# Patient Record
Sex: Female | Born: 1970
Health system: Southern US, Community
[De-identification: ages and names within clinical notes are randomized; demographics above are authoritative.]

## PROBLEM LIST (undated history)

## (undated) DIAGNOSIS — K769 Liver disease, unspecified: Secondary | ICD-10-CM

## (undated) DIAGNOSIS — Z950 Presence of cardiac pacemaker: Secondary | ICD-10-CM

## (undated) DIAGNOSIS — E079 Disorder of thyroid, unspecified: Secondary | ICD-10-CM

## (undated) DIAGNOSIS — E8881 Metabolic syndrome: Secondary | ICD-10-CM

## (undated) DIAGNOSIS — R7303 Prediabetes: Secondary | ICD-10-CM

## (undated) DIAGNOSIS — B009 Herpesviral infection, unspecified: Secondary | ICD-10-CM

## (undated) DIAGNOSIS — I341 Nonrheumatic mitral (valve) prolapse: Secondary | ICD-10-CM

## (undated) DIAGNOSIS — F419 Anxiety disorder, unspecified: Secondary | ICD-10-CM

## (undated) DIAGNOSIS — Z9889 Other specified postprocedural states: Secondary | ICD-10-CM

## (undated) DIAGNOSIS — I471 Supraventricular tachycardia, unspecified: Secondary | ICD-10-CM

## (undated) DIAGNOSIS — E7220 Disorder of urea cycle metabolism, unspecified: Secondary | ICD-10-CM

## (undated) DIAGNOSIS — E049 Nontoxic goiter, unspecified: Secondary | ICD-10-CM

## (undated) DIAGNOSIS — E559 Vitamin D deficiency, unspecified: Secondary | ICD-10-CM

## (undated) DIAGNOSIS — E785 Hyperlipidemia, unspecified: Secondary | ICD-10-CM

## (undated) DIAGNOSIS — N200 Calculus of kidney: Secondary | ICD-10-CM

## (undated) DIAGNOSIS — J189 Pneumonia, unspecified organism: Secondary | ICD-10-CM

## (undated) DIAGNOSIS — R112 Nausea with vomiting, unspecified: Secondary | ICD-10-CM

## (undated) DIAGNOSIS — K7460 Unspecified cirrhosis of liver: Secondary | ICD-10-CM

## (undated) DIAGNOSIS — E042 Nontoxic multinodular goiter: Secondary | ICD-10-CM

## (undated) DIAGNOSIS — N83209 Unspecified ovarian cyst, unspecified side: Secondary | ICD-10-CM

## (undated) DIAGNOSIS — I491 Atrial premature depolarization: Secondary | ICD-10-CM

## (undated) DIAGNOSIS — S83209A Unspecified tear of unspecified meniscus, current injury, unspecified knee, initial encounter: Secondary | ICD-10-CM

## (undated) DIAGNOSIS — K7581 Nonalcoholic steatohepatitis (NASH): Secondary | ICD-10-CM

## (undated) DIAGNOSIS — G43909 Migraine, unspecified, not intractable, without status migrainosus: Secondary | ICD-10-CM

## (undated) DIAGNOSIS — I10 Essential (primary) hypertension: Secondary | ICD-10-CM

## (undated) HISTORY — DX: Supraventricular tachycardia, unspecified: I47.10

## (undated) HISTORY — DX: Unspecified tear of unspecified meniscus, current injury, unspecified knee, initial encounter: S83.209A

## (undated) HISTORY — DX: Nonrheumatic mitral (valve) prolapse: I34.1

## (undated) HISTORY — PX: CARDIAC VALVE SURGERY: SHX40

## (undated) HISTORY — PX: GALLBLADDER SURGERY: SHX652

## (undated) HISTORY — PX: PACEMAKER INSERTION: SHX728

## (undated) HISTORY — DX: Nonalcoholic steatohepatitis (NASH): K75.81

## (undated) HISTORY — PX: TONSILLECTOMY: SUR1361

## (undated) HISTORY — PX: OTHER SURGICAL HISTORY: SHX169

## (undated) HISTORY — PX: ABLATION: SHX5711

## (undated) HISTORY — DX: Nontoxic multinodular goiter: E04.2

## (undated) HISTORY — DX: Herpesviral infection, unspecified: B00.9

## (undated) HISTORY — PX: KNEE CARTILAGE SURGERY: SHX688

## (undated) HISTORY — DX: Vitamin D deficiency, unspecified: E55.9

## (undated) HISTORY — DX: Prediabetes: R73.03

## (undated) HISTORY — DX: Unspecified cirrhosis of liver: K74.60

## (undated) HISTORY — DX: Migraine, unspecified, not intractable, without status migrainosus: G43.909

## (undated) HISTORY — DX: Atrial premature depolarization: I49.1

## (undated) HISTORY — DX: Disorder of urea cycle metabolism, unspecified: E72.20

## (undated) HISTORY — DX: Supraventricular tachycardia: I47.1

## (undated) HISTORY — PX: POLYPECTOMY: SHX149

## (undated) HISTORY — PX: ABDOMINAL HYSTERECTOMY: SHX81

## (undated) HISTORY — DX: Pneumonia, unspecified organism: J18.9

## (undated) HISTORY — DX: Unspecified ovarian cyst, unspecified side: N83.209

## (undated) HISTORY — PX: CARDIAC CATHETERIZATION: SHX172

---

## 2002-12-19 ENCOUNTER — Encounter: Payer: Self-pay | Admitting: Surgery

## 2002-12-19 ENCOUNTER — Emergency Department (HOSPITAL_COMMUNITY): Admission: EM | Admit: 2002-12-19 | Discharge: 2002-12-19 | Payer: Self-pay | Admitting: Emergency Medicine

## 2006-11-25 ENCOUNTER — Ambulatory Visit: Payer: Self-pay

## 2006-11-25 ENCOUNTER — Ambulatory Visit: Payer: Self-pay | Admitting: Internal Medicine

## 2006-11-25 ENCOUNTER — Encounter: Payer: Self-pay | Admitting: Internal Medicine

## 2010-10-14 NOTE — Assessment & Plan Note (Signed)
[facility]                            CARDIOLOGY OFFICE NOTE   [name], [name]                         MRN:          [id number]  DATE:11/25/2006                            DOB:          [birth year]    Tricia Ford CARDIOLOGY CLINIC NOTE   REASON FOR VISIT:  Chest pain.   HISTORY OF PRESENT ILLNESS:  Ms. Tricia Ford is a very pleasant 40 year old  woman with multiple medical problems including hypertension,  hyperlipidemia, history of gall stones status post cholecystectomy, and  supraventricular tachycardia status post multiple ablations.  She is  referred through the courtesy of Dr. Loyola Mast.   Ms. Tricia Ford denies any history of known coronary artery disease.  She has  had problems with SVT and has undergone 3 catheter ablations, 2 were  done in [facility] by Dr. Clovis Riley, and 1 was done at [facility] by Dr.  Dario Ave.  The last ablation was done in 2001.   She tells me that, over the past 3 weeks, she has developed central  chest pain.  She says the pain can come and go.  It feels like a crampy  sensation in her sternum.  It usually lasts seconds, but can sometimes  last longer.  She has not had any diaphoresis, but she did have an  episode where she felt hot and clammy.  On Tuesday, she had a severe  episode where it lasted all day and is a very intense pain.  She went to  [facility] and apparently had an EKG and some cardiac markers,  which were negative.  Then they scheduled her for a stress  echocardiogram, but due to cost reasons, she did not go.  Over the past  few days the pain has been somewhat better.  She has been taking some  Prevacid as well as muscle relaxants, but she has not noticed any  significant relief from that in particular.   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  During the day, she does not notice any palpitations,  but if she wakes up for another reason at night she does  note 30 seconds  of palpitations.  Then she is able to go back to bed.   REVIEW OF SYSTEMS:  She denies any bright red blood per rectum.  No  melena.  No fevers or chills.  No nausea or vomiting.  She did have an  ultrasound of her lower extremities due to varicose veins recently,  which did not show any evidence of clot in her left lower extremity.  She has not had any long car trips or plane rides recently.  The  remainder of the review of systems is negative, except for HPI and  problem list.   PROBLEM LIST:  1. SVT status post 3 ablations as described in the HPI.  2. Hypertension.  3. History of preeclampsia.  4. Hyperlipidemia with total cholesterol over 300 in the past.      Previously on Advicor.  5. Gall stones status post cholecystectomy.  6. Nephrolithiasis.   CURRENT MEDICATIONS:  Birth control pills.  Valtrex p.r.n.  Aspirin  p.r.n.   ALLERGIES:  No reported drug allergies.   SOCIAL HISTORY:  She is married.  She has 2 children, a 56 year old and  a 45-year-old.  She is a Theatre stage manager.  She denies any tobacco or  significant alcohol use.   FAMILY HISTORY:  Mother is 73 years old, alive.  She has diabetes,  hypertension, and tobacco use.  She does not know her dad.  She has 2  brothers, 1 is 78 and 1 is 28.  The 26 year old reportedly underwent  cardiac catheterization.  There is some question of a myocardial  infarction and angioplasty, but he has not had a stent.  Kateri Mc is 38  years old and died from a myocardial infarction.  He has a history of  multiple risk factors, including tobacco use.   PHYSICAL EXAM:  She is in no acute distress.  She ambulates around the  clinic without any respiratory difficulty.  Blood pressure is 126/78, heart rate 70, weight 185.  HEENT:  Normal.  NECK:  Supple.  There is no JVD.  Carotids are 2+ bilaterally without  bruits.  There is no lymphadenopathy.  Her thyroid area looks a bit  full, but I am unable to palpate any  significant thyromegaly or nodules.  CARDIAC:  She is a regular rate and rhythm.  No murmurs, rubs, or  gallops.  PMI is not displaced.  LUNGS:  Clear.  ABDOMEN:  Soft, nontender, nondistended.  No hepatosplenomegaly.  No  bruits.  No masses.  Good bowel sounds.  EXTREMITIES:  Warm with no cyanosis, clubbing, or edema.  She does have  mild varicosities.  No rash.  NEUROLOGIC:  Alert and oriented x3.  Cranial nerves 2-12 are intact.  Moves all 4 extremities without difficulty.  Affect is normal.  Pulses  normal throughout.   EKG shows normal sinus rhythm at a rate of 70 with no ST-T wave  abnormalities.   ASSESSMENT AND PLAN:  1. Atypical chest pain.  Given the quality of her symptoms, as well as      the fact that she had a prolonged episode of pain in the setting of      a normal EKG and normal cardiac markers at [facility], I      think the risk for significant underlying coronary artery disease      is quite low, especially given her age.  However, she does have      somewhat of a family history.  I suggested that it might be      reasonable to wait another week and to see if this gets better.      However, she is very interested in making sure that there is      nothing wrong as she has had a significant amount of problems in      the past.  We have thus gone ahead and scheduled her for a stress      echocardiogram, which will hopefully be able to be done today.  2. General medical issues.  By history, she does have multiple cardiac      risk factors, and we      discussed risk factor modification.  She will need to be followed      up, particularly for her cholesterol.     Bevelyn Buckles. Bensimhon, MD  Electronically Signed    DRB/MedQ  DD: 11/25/2006  DT: 11/25/2006  Job #: [id number]

## 2010-10-17 NOTE — Consult Note (Signed)
[name], Tricia Ford NO.:  [id number]   MEDICAL RECORD NO.:  [id number]                   PATIENT TYPE:  EMS   LOCATION:  ED                                   FACILITY:  [facility]   PHYSICIAN:  Velora Heckler, M.D.                DATE OF BIRTH:  04/15/1971   DATE OF CONSULTATION:  12/19/2002  DATE OF DISCHARGE:                                   CONSULTATION   REFERRING PHYSICIAN:  Melissa V. Rana Snare, M.D.   REASON FOR CONSULTATION:  Abdominal pain.   BRIEF HISTORY:  The patient is a 40 year old white female works at [employer], presents for evaluation with less then 24 hour history of lower  abdominal pain.  The patient notes sudden onset of lower abdominal pain  approximately 1 a.m. on July 20th.  This was associated with nausea, but no  emesis.  The patient denies fevers or chills.  She denies diarrhea or  constipation.  The pain was persistent in the suprapubic region.  The  patient did go to work, however, continued to have discomfort.  She does  note that the pain has improved over the course of the day.  The patient was  seen by Dr. Rana Snare.  Laboratory studies in the office showed a normal white  count and a normal urinalysis.  The patient is referred at this time to rule  out acute appendicitis.   PAST MEDICAL HISTORY:  1. History of Wolff-Parkinson-White syndrome evaluated at [facility] and [facility], status post     ablation of WPW x3.  2. History of irritable bowel syndrome.  3. History of hiatal hernia.  4. History of nephrolithiasis.  5. History of left knee arthroscopy.   MEDICATIONS:  Birth control pills, recently discontinued Cardizem.   ALLERGIES:  None known.   SOCIAL HISTORY:  The patient is married.  She has one child.  She works at  [employer] in [place].  She does not smoke.  She does not drink alcohol   FAMILY HISTORY:  Notable for cardiac  disease in a grandmother and a cancer  of unknown type in both grandfathers.   REVIEW OF SYSTEMS:  A 15-system review without significant other positives  except as noted above.   EXAM:  GENERAL:  A 40 year old, well-developed, well-nourished, white female  on a stretcher in the emergency department.  VITAL SIGNS:  Temp 97.1, pulse 75, respirations 18, blood pressure 139/90.  HEENT:  Normocephalic.  Sclerae are clear.  Conjunctivae are clear.  Dentition is good.  Mucous membranes are moist.  Voice is normal.  NECK:  Anterior examination of the neck shows it to be symmetric.  Thyroid  is normal without nodularity.  There is no anterior-posterior  lymphadenopathy.  There are no supraclavicular masses.  CHEST:  Clear to auscultation bilaterally.  There are no rhonchi or rales.  CARDIAC:  Regular rate and rhythm without murmur.  Peripheral pulses are  full.  ABDOMEN:  Soft.  There are bowel sounds present.  There is tenderness to  palpation in the bilateral lower quadrants of the suprapubic region.  There  is no palpable mass.  There is no guarding.  There is no rebound tenderness.  EXTREMITIES:  Nontender without edema.  NEUROLOGIC:  The patient is alert and oriented without focal deficit.   LABORATORY STUDIES:  White blood count 8.7, hemoglobin 12.9, platelet count  331,000,  segmented neutrophils 67%, lymphocytes 26%.   RADIOGRAPHIC STUDIES:  CT scan abdomen and pelvis reviewed with Dr. Davonna Belling shows a normal appendix.  There is a moderate amount of fluid in the  cul-de-sac.  There is a cystic structure visible in the right adnexa  consistent with ruptured ovarian cyst.   IMPRESSION:  No evidence of acute appendicitis, likely ruptured right  ovarian cyst.   PLAN:  1. Discharge with oral narcotics to take as needed for pain.  2. Advised to take nonsteroidal anti-inflammatories such as Advil or Aleve     as needed for pain.  3. Advise to follow up with a gynecologist for a  pelvic exam and pelvic     ultrasound within the next one to two weeks.                                               Velora Heckler, M.D.    TMG/MEDQ  D:  12/19/2002  T:  12/19/2002  Job:  [id number]   cc:   Angus Seller. Rana Snare, M.D.  [address] [place]  [place] [address]  Fax: [phone]   Cristino Martes, M.D.  [place]

## 2012-12-22 ENCOUNTER — Other Ambulatory Visit: Payer: Self-pay | Admitting: Endocrinology

## 2012-12-22 DIAGNOSIS — E042 Nontoxic multinodular goiter: Secondary | ICD-10-CM

## 2012-12-28 ENCOUNTER — Ambulatory Visit
Admission: RE | Admit: 2012-12-28 | Discharge: 2012-12-28 | Disposition: A | Discharge status: Home / Self Care | Payer: Commercial Managed Care - PPO | Source: Ambulatory Visit | Attending: Endocrinology | Admitting: Endocrinology

## 2012-12-28 ENCOUNTER — Other Ambulatory Visit (HOSPITAL_COMMUNITY)
Admission: RE | Admit: 2012-12-28 | Discharge: 2012-12-28 | Disposition: A | Discharge status: Home / Self Care | Payer: Commercial Managed Care - PPO | Source: Ambulatory Visit | Attending: Interventional Radiology | Admitting: Interventional Radiology

## 2012-12-28 DIAGNOSIS — E042 Nontoxic multinodular goiter: Secondary | ICD-10-CM

## 2012-12-28 DIAGNOSIS — E041 Nontoxic single thyroid nodule: Secondary | ICD-10-CM | POA: Insufficient documentation

## 2013-05-31 ENCOUNTER — Encounter (HOSPITAL_COMMUNITY): Payer: Self-pay | Admitting: Emergency Medicine

## 2013-05-31 ENCOUNTER — Emergency Department (HOSPITAL_COMMUNITY)
Admission: EM | Admit: 2013-05-31 | Discharge: 2013-05-31 | Disposition: A | Discharge status: Home / Self Care | Payer: Self-pay | Attending: Emergency Medicine | Admitting: Emergency Medicine

## 2013-05-31 ENCOUNTER — Emergency Department (HOSPITAL_COMMUNITY): Payer: Self-pay

## 2013-05-31 DIAGNOSIS — E119 Type 2 diabetes mellitus without complications: Secondary | ICD-10-CM | POA: Insufficient documentation

## 2013-05-31 DIAGNOSIS — J189 Pneumonia, unspecified organism: Secondary | ICD-10-CM | POA: Insufficient documentation

## 2013-05-31 DIAGNOSIS — R5381 Other malaise: Secondary | ICD-10-CM | POA: Insufficient documentation

## 2013-05-31 DIAGNOSIS — J029 Acute pharyngitis, unspecified: Secondary | ICD-10-CM | POA: Insufficient documentation

## 2013-05-31 DIAGNOSIS — I10 Essential (primary) hypertension: Secondary | ICD-10-CM | POA: Insufficient documentation

## 2013-05-31 DIAGNOSIS — R42 Dizziness and giddiness: Secondary | ICD-10-CM | POA: Insufficient documentation

## 2013-05-31 DIAGNOSIS — Z8719 Personal history of other diseases of the digestive system: Secondary | ICD-10-CM | POA: Insufficient documentation

## 2013-05-31 DIAGNOSIS — R209 Unspecified disturbances of skin sensation: Secondary | ICD-10-CM | POA: Insufficient documentation

## 2013-05-31 DIAGNOSIS — Z9089 Acquired absence of other organs: Secondary | ICD-10-CM | POA: Insufficient documentation

## 2013-05-31 DIAGNOSIS — R6889 Other general symptoms and signs: Secondary | ICD-10-CM | POA: Insufficient documentation

## 2013-05-31 DIAGNOSIS — Z95818 Presence of other cardiac implants and grafts: Secondary | ICD-10-CM | POA: Insufficient documentation

## 2013-05-31 DIAGNOSIS — Z87442 Personal history of urinary calculi: Secondary | ICD-10-CM | POA: Insufficient documentation

## 2013-05-31 DIAGNOSIS — R109 Unspecified abdominal pain: Secondary | ICD-10-CM | POA: Insufficient documentation

## 2013-05-31 DIAGNOSIS — F411 Generalized anxiety disorder: Secondary | ICD-10-CM | POA: Insufficient documentation

## 2013-05-31 HISTORY — DX: Metabolic syndrome: E88.81

## 2013-05-31 HISTORY — DX: Essential (primary) hypertension: I10

## 2013-05-31 HISTORY — DX: Liver disease, unspecified: K76.9

## 2013-05-31 HISTORY — DX: Hyperlipidemia, unspecified: E78.5

## 2013-05-31 HISTORY — DX: Nontoxic goiter, unspecified: E04.9

## 2013-05-31 HISTORY — DX: Disorder of thyroid, unspecified: E07.9

## 2013-05-31 HISTORY — DX: Calculus of kidney: N20.0

## 2013-05-31 HISTORY — DX: Gilbert syndrome: E80.4

## 2013-05-31 HISTORY — DX: Metabolic syndrome: E88.810

## 2013-05-31 HISTORY — DX: Anxiety disorder, unspecified: F41.9

## 2013-05-31 LAB — URINALYSIS, ROUTINE W REFLEX MICROSCOPIC
Bilirubin Urine: NEGATIVE
Glucose, UA: NEGATIVE mg/dL
Hgb urine dipstick: NEGATIVE
Protein, ur: NEGATIVE mg/dL
Urobilinogen, UA: 0.2 mg/dL (ref 0.0–1.0)

## 2013-05-31 LAB — COMPREHENSIVE METABOLIC PANEL
ALT: 94 U/L — ABNORMAL HIGH (ref 0–35)
AST: 50 U/L — ABNORMAL HIGH (ref 0–37)
Alkaline Phosphatase: 130 U/L — ABNORMAL HIGH (ref 39–117)
CO2: 21 mEq/L (ref 19–32)
Chloride: 102 mEq/L (ref 96–112)
GFR calc Af Amer: >90 mL/min (ref >90)
GFR calc non Af Amer: >90 mL/min (ref >90)
Glucose, Bld: 127 mg/dL — ABNORMAL HIGH (ref 70–99)
Potassium: 3.8 mEq/L (ref 3.7–5.3)
Sodium: 138 mEq/L (ref 137–147)
Total Bilirubin: 1 mg/dL (ref 0.3–1.2)

## 2013-05-31 LAB — POCT I-STAT TROPONIN I: Troponin i, poc: 0 ng/mL (ref 0.00–0.08)

## 2013-05-31 LAB — TSH: TSH: 0.657 u[IU]/mL (ref 0.350–4.500)

## 2013-05-31 LAB — CBC WITH DIFFERENTIAL/PLATELET
Basophils Absolute: 0 10*3/uL (ref 0.0–0.1)
Eosinophils Relative: 0 % (ref 0–5)
Lymphocytes Relative: 10 % — ABNORMAL LOW (ref 12–46)
Lymphs Abs: 1.1 10*3/uL (ref 0.7–4.0)
MCV: 85.7 fL (ref 78.0–100.0)
Neutro Abs: 9.5 10*3/uL — ABNORMAL HIGH (ref 1.7–7.7)
Platelets: 330 10*3/uL (ref 150–400)
RBC: 4.6 MIL/uL (ref 3.87–5.11)
WBC: 11 10*3/uL — ABNORMAL HIGH (ref 4.0–10.5)

## 2013-05-31 LAB — URINE MICROSCOPIC-ADD ON

## 2013-05-31 LAB — GLUCOSE, CAPILLARY: Glucose-Capillary: 101 mg/dL — ABNORMAL HIGH (ref 70–99)

## 2013-05-31 LAB — HCG, SERUM, QUALITATIVE: Preg, Serum: NEGATIVE

## 2013-05-31 MED ORDER — AZITHROMYCIN 250 MG PO TABS
500.0000 mg | ORAL_TABLET | Freq: Once | ORAL | Status: AC
  Administered 2013-05-31: 500 mg via ORAL
  Filled 2013-05-31: qty 2

## 2013-05-31 MED ORDER — CEFPROZIL 500 MG PO TABS: 500.0000 mg | ORAL_TABLET | Freq: Two times a day (BID) | ORAL | Status: DC

## 2013-05-31 MED ORDER — SODIUM CHLORIDE 0.9 % IV BOLUS (SEPSIS)
1000.0000 mL | Freq: Once | INTRAVENOUS | Status: AC
  Administered 2013-05-31: 1000 mL via INTRAVENOUS

## 2013-05-31 MED ORDER — AZITHROMYCIN 250 MG PO TABS: 250.0000 mg | ORAL_TABLET | Freq: Every day | ORAL | Status: DC

## 2013-05-31 MED ORDER — DEXTROSE 5 % IV SOLN
1.0000 g | Freq: Once | INTRAVENOUS | Status: AC
  Administered 2013-05-31: 1 g via INTRAVENOUS
  Filled 2013-05-31: qty 10

## 2013-05-31 NOTE — ED Notes (Addendum)
Per EMS, Pt, c/o flu like symptoms, cough, cold, and congestion x 5 days and generalized abdominal cramping started en route.  Pt sts "I had a rattle in my chest this morning and then my upper extremities went cold and numb.  This has happened 4 times and has currently resolved.  I feel like everything loosened and I had no control.  I just don't feel right."  Pt did not take her xanax last night.  Pt was seen by PCP x 4 days and tested for flu, which resulted negative.  Pt reports that she has "tested negative in the past, when she actually had the flu."  NAD noted.  BP is elevated and Pt is non-compliant.

## 2013-05-31 NOTE — ED Provider Notes (Addendum)
CSN: [id number]     Arrival date & time 05/31/13  8657 History   First MD Initiated Contact with Patient 05/31/13 1009     Chief Complaint  Patient presents with  . Flu like Symptoms   . Numbness  . Abdominal Pain   HPI  Patient presents here with multiple complaints. Her main reason for presentation this morning as she was sitting at her desk and she hurt her chest which she describes as "death rattle". She has a cough congestion is identified illness since Friday with cough congestion. Has some nausea some abdominal cramping today as well but had some diarrhea after arriving here. En route his flexion is quite anxious. She felt some numbness and heaviness in her bilateral arms. This improved her some reassurance per EMS. This recurred several times en route. She arrives here without the feeling of numbness or extremity symptoms. She does have chest pain. She is also short of breath currently. She has had shortness of breath and had a negative flu swab on Friday. She is rather insistent that she has the flu and states that last year she had a negative flu swab on one day, and the next day was positive. She has not been febrile. Her eyes have been bloodshot.  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 She is concerned that she is going to die because she felt the death rattle. She thought she was going to pass out for a short time. She states that she has "stage I liver disease". And she states she's been told this is "disregard in my liver". She states "had to go to [facility] and he thought it was stage III but isn't". She occasionally gets a heavy time difficulty with concentration. She states usually this her blood pressure is high. She takes blood pressure medicine although when she finds her blood pressure high. She states "I'm noncompliant with that I went away it makes me feel". I've advised her  against using TIA-like symptoms to be diaper when she will take her blood pressure medications.   She also states "sometimes when my liver acts up,and  ammonia gets high". She does also state that they researched thyroid disease on the Internet and they're fairly convinced her thyroid is abnormal. She states she's had multiple evaluations and has 2 thyroid nodules. She's had 2 negative thyroid biopsies for malignancy. She cannot tell what her thyroid function has been in the past. She's never been on treatment for hyper, or hypothyroidism. She normally does take xanax at  night. She did not take last night because she taking NyQuil.  Past Medical History  Diagnosis Date  . Liver disease   . Hypertension   . Gilbert syndrome   . Diabetes mellitus without complication   . Metabolic syndrome   . Anxiety   . Hyperlipidemia   . Kidney stone   . Thyroid disease   . Goiter    Past Surgical History  Procedure Laterality Date  . Ablation    . Cardiac catheterization    . Abdominal hysterectomy    . Cesarean section    . Tonsillectomy    . Knee cartilage surgery    . Liver biopsy     History reviewed. No pertinent family history. History  Substance Use Topics  . Smoking status: Never Smoker   . Smokeless tobacco: Never Used  .  Alcohol Use: Yes     Comment: rarely   OB History   Grav Para Term Preterm Abortions TAB SAB Ect Mult Living                 Review of Systems  Constitutional: Positive for fatigue. Negative for fever, chills, diaphoresis and appetite change.  HENT: Positive for congestion and sore throat. Negative for mouth sores and trouble swallowing.   Eyes: Negative for visual disturbance.  Respiratory: Negative for cough, chest tightness, shortness of breath and wheezing.        A feeling of "congestion" and the description of a "death rattle" in her chest with breathing at the office  Cardiovascular: Negative for chest pain.  Gastrointestinal: Negative for nausea,  vomiting, abdominal pain, diarrhea and abdominal distention.  Endocrine: Positive for cold intolerance and heat intolerance. Negative for polydipsia, polyphagia and polyuria.  Genitourinary: Negative for dysuria, frequency and hematuria.  Musculoskeletal: Negative for gait problem.  Skin: Negative for color change, pallor and rash.  Neurological: Positive for dizziness. Negative for syncope, light-headedness and headaches.  Hematological: Does not bruise/bleed easily.  Psychiatric/Behavioral: Negative for behavioral problems and confusion.    Allergies  Zomig  Home Medications   Current Outpatient Rx  Name  Route  Sig  Dispense  Refill  . ALPRAZolam (XANAX) 0.5 MG tablet   Oral   Take 0.5 mg by mouth 3 (three) times daily as needed for anxiety.         Marland Kitchen DM-Doxylamine-Acetaminophen (NYQUIL COLD & FLU PO)   Oral   Take 30 mLs by mouth at bedtime as needed (cold/flu).         Marland Kitchen ibuprofen (ADVIL,MOTRIN) 200 MG tablet   Oral   Take 800 mg by mouth every 6 (six) hours as needed for mild pain or moderate pain.         Marland Kitchen lisinopril (PRINIVIL,ZESTRIL) 10 MG tablet   Oral   Take 10 mg by mouth daily as needed (when BP is high).         . valACYclovir (VALTREX) 500 MG tablet   Oral   Take 500 mg by mouth 2 (two) times daily as needed (fever blisters).         Marland Kitchen azithromycin (ZITHROMAX Z-PAK) 250 MG tablet   Oral   Take 1 tablet (250 mg total) by mouth daily.   20 tablet   0   . cefPROZIL (CEFZIL) 500 MG tablet   Oral   Take 1 tablet (500 mg total) by mouth 2 (two) times daily.   20 tablet   0    BP 151/100  Pulse 84  Temp(Src) 98 F (36.7 C) (Oral)  Resp 20  SpO2 100% Physical Exam  Constitutional: She is oriented to person, place, and time. She appears well-developed and well-nourished. No distress.  Awake alert. Rapid speech. Seems somewhat anxious.  HENT:  Head: Normocephalic.  Conjunctiva are not injected. Sclerae are anicteric. Conjunctivae are not  pale  Eyes: Conjunctivae are normal. Pupils are equal, round, and reactive to light. No scleral icterus.  Neck: Normal range of motion. Neck supple. No thyromegaly present.  No bruits in her neck.  No thyromegaly  Cardiovascular: Normal rate and regular rhythm.  Exam reveals no gallop and no friction rub.   No murmur heard. Heart tones. Not tachycardic. No arrhythmias or ectopy.  Pulmonary/Chest: Effort normal and breath sounds normal. No respiratory distress. She has no wheezes. She has no rales.  Clear breath sounds. No  prolongation.  Abdominal: Soft. Bowel sounds are normal. She exhibits no distension. There is no tenderness. There is no rebound.  No abdominal pain exam and. Benign abdomen is normal active bowel sounds  Musculoskeletal: Normal range of motion.  Neurological: She is alert and oriented to person, place, and time.  Normal use of the upper extremities without pronator drift or weakness. Normal sensation. Normal finger to nose. Normal strength in lower extremities.  Skin: Skin is warm and dry. No rash noted.  Psychiatric: She has a normal mood and affect. Her behavior is normal.    ED Course  Procedures (including critical care time) Labs Review Labs Reviewed  GLUCOSE, CAPILLARY - Abnormal; Notable for the following:    Glucose-Capillary 101 (*)    All other components within normal limits  CBC WITH DIFFERENTIAL - Abnormal; Notable for the following:    WBC 11.0 (*)    Neutrophils Relative % 86 (*)    Neutro Abs 9.5 (*)    Lymphocytes Relative 10 (*)    All other components within normal limits  COMPREHENSIVE METABOLIC PANEL - Abnormal; Notable for the following:    Glucose, Bld 127 (*)    AST 50 (*)    ALT 94 (*)    Alkaline Phosphatase 130 (*)    All other components within normal limits  URINALYSIS, ROUTINE W REFLEX MICROSCOPIC - Abnormal; Notable for the following:    Specific Gravity, Urine 1.003 (*)    Leukocytes, UA SMALL (*)    All other components  within normal limits  URINE MICROSCOPIC-ADD ON - Abnormal; Notable for the following:    Squamous Epithelial / LPF FEW (*)    All other components within normal limits  LIPASE, BLOOD  HCG, SERUM, QUALITATIVE  AMMONIA  TSH  CG4 I-STAT (LACTIC ACID)  POCT I-STAT TROPONIN I   Imaging Review No results found.  EKG Interpretation    Date/Time:  Wednesday May 31 2013 11:20:41 EST Ventricular Rate:  84 PR Interval:  173 QRS Duration: 79 QT Interval:  386 QTC Calculation: 456 R Axis:   61 Text Interpretation:  Sinus rhythm Consider left ventricular hypertrophy ED PHYSICIAN INTERPRETATION AVAILABLE IN [facility] Confirmed by TEST, RECORD ([id number]) on 06/02/2013 9:07:54 AM            MDM   1. Lingular pneumonia     Patient has lung complaints upon arrival. I think her intermittent feeling of paresthesias and temperature was approximately to anxiety. She denies that she was hyperventilating. However on arrival here she is still anxious. She is in a sinus rhythm essentially asymptomatic. Suddenly she felt better after a bowel movement with some diarrhea. Her cramping is resolved. She feels like his normal control rub extremities without symptoms. She is neurologically intact on exam.  11:41: I was called back to  the room by our staff as the patient had alerted that she was "feeling it again ". Her husband (who is quite anxious and somewhat overbearing here) stated that her "number on the top skyrocketed". He indicates the numbers on the monitor. He cannot tell me what it showed her rate to be. She stated, " I don't know a lot about EKGs but when I  looked at that monitor my Q waves had gotten really wide". When the patient called the unit clerk about her recurrance of symptoms, the clerk very astutely and quickly printed rhythm strips which showed sinus rhythm with a normal-appearing morphology and intervals.  She has not had episodes of  tachycardia. I've examined the patient  again,  she is very anxious. Husband is very upset that "you have any been treated her blood pressure". Recheck blood pressure is 149/89 she is in sinus rhythm she is alert, and very anxious as is her husband.  Is able to at least for the time being reassure her that needed additional studies including x-ray labs and an order to counsel her regarding her symptoms.  13:47:  Discuss all of her laboratory results and x-rays with her. Explained lingular pneumonia to her. She been treated with IV Rocephin and by mouth Zithromax. Per Dr. Ramiro Harvest her physician for follow up on her TSH.  Appropriate for outpatient treatment. This may represent a post influenza pneumonia. If not improving over the next several days immediate recheck with primary care. ER with acute worsening.    Rolland Porter, MD 05/31/13 1152  Rolland Porter, MD 06/04/13 [phone]

## 2013-05-31 NOTE — ED Notes (Signed)
Made aware of symptoms return please return

## 2013-05-31 NOTE — Progress Notes (Signed)
   CARE MANAGEMENT ED NOTE 05/31/2013  Patient:  Tricia Ford, Tricia Ford   Account Number:  [id number]  Date Initiated:  05/31/2013  Documentation initiated by:  Edd Arbour  Subjective/Objective Assessment:   42 yr old female umr/uhc ppo pt c/o flu like symptoms, cough, cold, and congestion x 5 days and generalized abdominal cramping     Subjective/Objective Assessment Detail:   no pcp listed in EPIC Pt states pcp is Dr Fredia Beets in [facility] family [facility]     Action/Plan:   EPIC updated   Action/Plan Detail:   Anticipated DC Date:  05/31/2013     Status Recommendation to Physician:   Result of Recommendation:    Other ED Services  Consult Working Plan    DC Planning Services  Other  PCP issues  Outpatient Services - Pt will follow up    Choice offered to / List presented to:            Status of service:  Completed, signed off  ED Comments:   ED Comments Detail:

## 2013-08-31 ENCOUNTER — Other Ambulatory Visit: Payer: Self-pay | Admitting: Endocrinology

## 2013-08-31 DIAGNOSIS — E049 Nontoxic goiter, unspecified: Secondary | ICD-10-CM

## 2013-09-05 ENCOUNTER — Ambulatory Visit
Admission: RE | Admit: 2013-09-05 | Discharge: 2013-09-05 | Disposition: A | Discharge status: Home / Self Care | Payer: 59 | Source: Ambulatory Visit | Attending: Endocrinology | Admitting: Endocrinology

## 2013-09-05 DIAGNOSIS — E049 Nontoxic goiter, unspecified: Secondary | ICD-10-CM

## 2013-11-17 ENCOUNTER — Ambulatory Visit (INDEPENDENT_AMBULATORY_CARE_PROVIDER_SITE_OTHER)
Admission: RE | Admit: 2013-11-17 | Discharge: 2013-11-17 | Disposition: A | Discharge status: Home / Self Care | Payer: 59 | Source: Ambulatory Visit | Attending: Internal Medicine | Admitting: Internal Medicine

## 2013-11-17 ENCOUNTER — Ambulatory Visit (INDEPENDENT_AMBULATORY_CARE_PROVIDER_SITE_OTHER): Payer: 59 | Admitting: Internal Medicine

## 2013-11-17 ENCOUNTER — Encounter (INDEPENDENT_AMBULATORY_CARE_PROVIDER_SITE_OTHER): Payer: Self-pay

## 2013-11-17 ENCOUNTER — Encounter: Payer: Self-pay | Admitting: Internal Medicine

## 2013-11-17 VITALS — BP 132/98 | HR 71 | Temp 97.9°F | Ht 67.0 in | Wt 214.8 lb

## 2013-11-17 DIAGNOSIS — Z23 Encounter for immunization: Secondary | ICD-10-CM

## 2013-11-17 DIAGNOSIS — R05 Cough: Secondary | ICD-10-CM

## 2013-11-17 DIAGNOSIS — R059 Cough, unspecified: Secondary | ICD-10-CM

## 2013-11-17 DIAGNOSIS — R058 Other specified cough: Secondary | ICD-10-CM

## 2013-11-17 DIAGNOSIS — I10 Essential (primary) hypertension: Secondary | ICD-10-CM

## 2013-11-17 NOTE — Progress Notes (Signed)
Subjective:    Patient ID: Tricia Ford, female    DOB: 05/29/1971  MRN: [id number]  HPI  [name]  42 yowf never smoker, exp to mother smoking, "always" sickly as child with pna age 89 hosp and missed a bunch of school, never able to keep up in sports then since around  2008 freq flares dx as bronchitis/ pna placed on acei around around 2013 and has not recovered from last episode from flu early Dec 2014 with lingular pna on cxr 05/31/13.   11/17/2013 1st [facility] office visit/ Tricia Ford / on ACEi Chief Complaint  Patient presents with  . PULMONARY CONSULT    Self referral- h/o PNA, wheezing, SOB. Cxr 05/31/14. Completed zpak x 5 days ago for URI.   baseline =  sob walking and talking but sometimes even at rest if talking  assoc with voice fatigue/ hoarseness Cough sporadic/ mostly daytimes, sometimes severe, always dry  with  assoc sore throat at times esp at end of day  Only thing that makes it better is narcotic cough med. Already on levaquin and no better   No obvious other patterns in day to day or daytime variabilty or assoc chronic cough or cp or chest tightness, subjective wheeze overt sinus or hb symptoms. No unusual exp hx or h/o childhood   asthma or knowledge of premature birth.  Sleeping ok without nocturnal  or early am exacerbation  of respiratory  c/o's or need for noct saba. Also denies any obvious fluctuation of symptoms with weather or environmental changes or other aggravating or alleviating factors except as outlined above   Current Medications, Allergies, Complete Past Medical History, Past Surgical History, Family History, and Social History were reviewed in [facility] electronic medical record.              Review of Systems  Constitutional: Positive for unexpected weight change. Negative for fever.  HENT: Positive for congestion, ear pain, sneezing and sore throat. Negative for dental problem, nosebleeds, postnasal drip, rhinorrhea, sinus  pressure and trouble swallowing.   Eyes: Negative for redness and itching.  Respiratory: Positive for choking and shortness of breath. Negative for cough, chest tightness and wheezing.   Cardiovascular: Positive for palpitations. Negative for leg swelling.  Gastrointestinal: Negative for nausea and vomiting.  Genitourinary: Negative for dysuria.  Musculoskeletal: Positive for arthralgias. Negative for joint swelling.  Skin: Negative for rash.  Neurological: Positive for headaches.  Hematological: Does not bruise/bleed easily.  Psychiatric/Behavioral: Positive for dysphoric mood. The patient is nervous/anxious.        Objective:   Physical Exam  Wt Readings from Last 3 Encounters:  11/17/13 214 lb 12.8 oz (97.433 kg)      Pleasant health appearing wf with classic voice fatigue and harsh barking cough  HEENT: nl dentition, turbinates, and orophanx. Nl external ear canals without cough reflex   NECK :  without JVD/Nodes/TM/ nl carotid upstrokes bilaterally   LUNGS: no acc muscle use, clear to A and P bilaterally without cough on insp or exp maneuvers   CV:  RRR  no s3 or murmur or increase in P2, no edema   ABD:  soft and nontender with nl excursion in the supine position. No bruits or organomegaly, bowel sounds nl  MS:  warm without deformities, calf tenderness, cyanosis or clubbing  SKIN: warm and dry without lesions    NEURO:  alert, approp, no deficits     cxr 05/31/14 Lingula versus anterior upper lobe pneumonia. Post  treatment  radiographs recommended to document resolution.  CXR  11/17/2013 :  No active cardiopulmonary disease.     Assessment & Plan:

## 2013-11-17 NOTE — Patient Instructions (Addendum)
Pantoprazole (protonix) 40 mg   Take 30-60 min before first meal of the day and Pepcid 20 mg one bedtime until return to office - this is the best way to tell whether stomach acid is contributing to your problem.    Stop lisinopril  GERD (REFLUX)  is an extremely common cause of respiratory symptoms, many times with no significant heartburn at all.    It can be treated with medication, but also with lifestyle changes including avoidance of late meals, excessive alcohol, smoking cessation, and avoid fatty foods, chocolate, peppermint, colas, red wine, and acidic juices such as orange juice.  NO MINT OR MENTHOL PRODUCTS SO NO COUGH DROPS  USE SUGARLESS CANDY INSTEAD (jolley ranchers or Quarry managertover's or lifesavers)  NO OIL BASED VITAMINS - use powdered substitutes.  Best cough med is delsym 2 tsp every 12 hours but if not effective and you can afford to be drowsy go ahead and use your narcotic containing cough med  For drainage sensation/ tickle in throat  take chlortrimeton (chlorpheniramine) 4 mg every 4 hours available over the counter (may cause drowsiness)   Pneumovax today  Please remember to go to the x-ray department downstairs for your tests - we will call you with the results when they are available.    Please schedule a follow up office visit in 2 weeks, sooner if needed with all active meds in hand to regroup

## 2013-11-20 DIAGNOSIS — I10 Essential (primary) hypertension: Secondary | ICD-10-CM | POA: Insufficient documentation

## 2013-11-20 NOTE — Progress Notes (Signed)
Quick Note:  LMTCB ______ 

## 2013-11-20 NOTE — Assessment & Plan Note (Addendum)
ACE inhibitors are problematic in  pts with airway complaints because  even experienced pulmonologists can't always distinguish ace effects from copd/asthma.  By themselves they don't actually cause a problem, much like oxygen can't by itself start a fire, but they certainly serve as a powerful catalyst or enhancer for any "fire"  or inflammatory process in the upper airway, be it caused by an ET  tube or more commonly reflux (especially in the obese or pts with known GERD or who are on biphoshonates).   She is only using the acei prn anyway and it's not effective this way so ok for now to stop it and titrate clonidine up to a max dose of 0.3 tid

## 2013-11-20 NOTE — Progress Notes (Signed)
Quick Note:  Spoke with pt and notified of results per Dr. Wert. Pt verbalized understanding and denied any questions.  ______ 

## 2013-11-20 NOTE — Assessment & Plan Note (Addendum)
Classic Upper airway cough syndrome, so named because it's frequently impossible to sort out how much is  CR/sinusitis with freq throat clearing (which can be related to primary GERD)   vs  causing  secondary (" extra esophageal")  GERD from wide swings in gastric pressure that occur with throat clearing, often  promoting self use of mint and menthol lozenges that reduce the lower esophageal sphincter tone and exacerbate the problem further in a cyclical fashion.   These are the same pts (now being labeled as having "irritable larynx syndrome" by some cough centers) who not infrequently have a history of having failed to tolerate ace inhibitors (which is very likely the case here)   dry powder inhalers or biphosphonates or report having atypical reflux symptoms that don't respond to standard doses of PPI , and are easily confused as having aecopd or asthma flares by even experienced allergists/ pulmonologists.   For now try off acei and on max gerd rx then regroup in a month  See instructions for specific recommendations which were reviewed directly with the patient who was given a copy with highlighter outlining the key components.

## 2013-11-30 ENCOUNTER — Encounter (HOSPITAL_COMMUNITY): Payer: Self-pay

## 2013-12-04 ENCOUNTER — Telehealth: Payer: Self-pay | Admitting: Internal Medicine

## 2013-12-04 ENCOUNTER — Encounter: Payer: Self-pay | Admitting: Internal Medicine

## 2013-12-04 ENCOUNTER — Ambulatory Visit (INDEPENDENT_AMBULATORY_CARE_PROVIDER_SITE_OTHER): Payer: 59 | Admitting: Internal Medicine

## 2013-12-04 VITALS — BP 130/80 | HR 64 | Temp 97.9°F | Ht 67.0 in | Wt 215.0 lb

## 2013-12-04 DIAGNOSIS — R059 Cough, unspecified: Secondary | ICD-10-CM

## 2013-12-04 DIAGNOSIS — R058 Other specified cough: Secondary | ICD-10-CM

## 2013-12-04 DIAGNOSIS — R05 Cough: Secondary | ICD-10-CM

## 2013-12-04 MED ORDER — METHYLPREDNISOLONE ACETATE 80 MG/ML IJ SUSP
120.0000 mg | Freq: Once | INTRAMUSCULAR | Status: AC
  Administered 2013-12-04: 120 mg via INTRAMUSCULAR

## 2013-12-04 MED ORDER — FAMOTIDINE 20 MG PO TABS: ORAL_TABLET | ORAL | Status: DC

## 2013-12-04 MED ORDER — PANTOPRAZOLE SODIUM 40 MG PO TBEC: 40.0000 mg | DELAYED_RELEASE_TABLET | Freq: Every day | ORAL | Status: DC

## 2013-12-04 NOTE — Telephone Encounter (Signed)
lmomtcb x1 

## 2013-12-04 NOTE — Progress Notes (Signed)
Subjective:    Patient ID: Tricia Ford, female    DOB: 10/18/1970  MRN: [id number]  HPI  [name]  42 yowf never smoker, exp to mother smoking, "always" sickly as child with pna age 89 hosp and missed a bunch of school, never able to keep up in sports then since around  2008 freq flares dx as bronchitis/ pna placed on acei around around 2013 and has not recovered from last episode from flu early Dec 2014 with lingular pna on cxr 05/31/13.   11/17/2013 1st [facility] office visit/ Sherene SiresWert / on ACEi Chief Complaint  Patient presents with  . PULMONARY CONSULT    Self referral- h/o PNA, wheezing, SOB. Cxr 05/31/14. Completed zpak x 5 days ago for URI.   baseline =  sob walking and talking but sometimes even at rest if talking  assoc with voice fatigue/ hoarseness Cough sporadic/ mostly daytimes, sometimes severe, always dry  with  assoc sore throat at times esp at end of day  Only thing that makes it better is narcotic cough med. Already on levaquin and no better  rec Pantoprazole (protonix) 40 mg   Take 30-60 min before first meal of the day and Pepcid 20 mg one bedtime until return to office - this is the best way to tell whether stomach acid is contributing to your problem.   Stop lisinopril GERD   Best cough med is delsym 2 tsp every 12 hours but if not effective and you can afford to be drowsy go ahead and use your narcotic containing cough med For drainage sensation/ tickle in throat  take chlortrimeton (chlorpheniramine) 4 mg every 4 hours available over the counter (may cause drowsiness)  Pneumovax today Please schedule a follow up office visit in 2 weeks, sooner if needed with all active meds in hand to regroup  12/04/2013 f/u ov/Berry Gallacher re: cough/ did not bring meds / did not purchase chlorpheniramine as rec Chief Complaint  Patient presents with  . Follow-up    Pt reports her cough is some better, but not yet resolved. No new co's today.    Not limited by breathing  from desired activities   Cough dry, daytime constant sense of drainage but no excess mucus   No obvious day to day or daytime variabilty or assoc  cp or chest tightness, subjective wheeze overt sinus or hb symptoms. No unusual exp hx or h/o childhood pna/ asthma or knowledge of premature birth.  Sleeping ok without nocturnal  or early am exacerbation  of respiratory  c/o's or need for noct saba. Also denies any obvious fluctuation of symptoms with weather or environmental changes or other aggravating or alleviating factors except as outlined above   Current Medications, Allergies, Complete Past Medical History, Past Surgical History, Family History, and Social History were reviewed in [facility] electronic medical record.  ROS  The following are not active complaints unless bolded sore throat, dysphagia, dental problems, itching, sneezing,  nasal congestion or excess/ purulent secretions, ear ache,   fever, chills, sweats, unintended wt loss, pleuritic or exertional cp, hemoptysis,  orthopnea pnd or leg swelling, presyncope, palpitations, heartburn, abdominal pain, anorexia, nausea, vomiting, diarrhea  or change in bowel or urinary habits, change in stools or urine, dysuria,hematuria,  rash, arthralgias, visual complaints, headache, numbness weakness or ataxia or problems with walking or coordination,  change in mood/affect or memory.  Objective:   Physical Exam   Wt Readings from Last 3 Encounters:  12/04/13 215 lb (97.523 kg)  11/17/13 214 lb 12.8 oz (97.433 kg)         Pleasant health appearing wf with classic voice fatigue and no longer harsh barking cough still throat clearing   HEENT: nl dentition, turbinates, and orophanx. Nl external ear canals without cough reflex   NECK :  without JVD/Nodes/TM/ nl carotid upstrokes bilaterally   LUNGS: no acc muscle use, clear to A and P bilaterally without cough on insp or exp maneuvers   CV:  RRR   no s3 or murmur or increase in P2, no edema   ABD:  soft and nontender with nl excursion in the supine position. No bruits or organomegaly, bowel sounds nl  MS:  warm without deformities, calf tenderness, cyanosis or clubbing  SKIN: warm and dry without lesions          cxr 05/31/14 Lingula versus anterior upper lobe pneumonia. Post treatment  radiographs recommended to document resolution.  CXR  11/17/2013 :  No active cardiopulmonary disease.     Assessment & Plan:

## 2013-12-04 NOTE — Patient Instructions (Addendum)
Pantoprazole (protonix) 40 mg   Take 30-60 min before first meal of the day and Pepcid 20 mg one bedtime until return to office - this is the best way to tell whether stomach acid is contributing to your problem.    GERD (REFLUX)  is an extremely common cause of respiratory symptoms, many times with no significant heartburn at all.    It can be treated with medication, but also with lifestyle changes including avoidance of late meals, excessive alcohol, smoking cessation, and avoid fatty foods, chocolate, peppermint, colas, red wine, and acidic juices such as orange juice.  NO MINT OR MENTHOL PRODUCTS SO NO COUGH DROPS  USE SUGARLESS CANDY INSTEAD (jolley ranchers or Quarry managertover's or lifesavers)  NO OIL BASED VITAMINS - use powdered substitutes.  Best cough med is delsym 2 tsp every 12 hours but if not effective and you can afford to be drowsy go ahead and use your narcotic containing cough med  For drainage sensation/ tickle in throat  take chlortrimeton (chlorpheniramine) 4 mg every 4 hours available over the counter (may cause drowsiness)   Please schedule a follow up office visit in 4 weeks, sooner if needed with all active meds in hand to regroup

## 2013-12-05 NOTE — Assessment & Plan Note (Signed)
-   dc'd  acei  11/17/2013   Still strongly favor  Classic Upper airway cough syndrome, so named because it's frequently impossible to sort out how much is  CR/sinusitis with freq throat clearing (which can be related to primary GERD)   vs  causing  secondary (" extra esophageal")  GERD from wide swings in gastric pressure that occur with throat clearing, often  promoting self use of mint and menthol lozenges that reduce the lower esophageal sphincter tone and exacerbate the problem further in a cyclical fashion.   These are the same pts (now being labeled as having "irritable larynx syndrome" by some cough centers) who not infrequently have a history of having failed to tolerate ace inhibitors,  dry powder inhalers or biphosphonates or report having atypical reflux symptoms that don't respond to standard doses of PPI , and are easily confused as having aecopd or asthma flares by even experienced allergists/ pulmonologists.    rec leave off ACEi, add short term gerd rx and 1st gen h1, return in 4 weeks with all meds if not 100%

## 2013-12-06 ENCOUNTER — Telehealth: Payer: Self-pay | Admitting: Internal Medicine

## 2013-12-06 NOTE — Telephone Encounter (Signed)
Paperwork for PA filled out and given to [place]lie to have MW fill out 2 questions then fax to [phone]. Will forward to [place].

## 2013-12-06 NOTE — Telephone Encounter (Signed)
Called and lmom  X 2

## 2013-12-06 NOTE — Telephone Encounter (Signed)
Pt needs PA for Protonix- I have started this PA; please see new phone message dated 12-06-13. Thanks.

## 2013-12-07 NOTE — Telephone Encounter (Signed)
Per phone note 12/06/13; Paperwork for PA filled out and given to Verlon AuLeslie to have MW fill out 2 questions then fax to [phone]. [facility]   LMTCB x1 for pt to make aware

## 2013-12-07 NOTE — Telephone Encounter (Signed)
I called made pt aware of the below. She voiced her understanding. Will forward to leslie to f/u on approval/denial

## 2013-12-07 NOTE — Telephone Encounter (Signed)
Pt returned call, needs to talk to nurse

## 2013-12-07 NOTE — Telephone Encounter (Signed)
Patient returned call

## 2013-12-08 NOTE — Telephone Encounter (Signed)
Done- will await approval denial  Will close this encounter since there is already a msg regarding the same

## 2013-12-13 NOTE — Telephone Encounter (Signed)
Encounter closed in error  I never received anything back on her PA for the protonix yet  Will call and check on this later today

## 2013-12-13 NOTE — Telephone Encounter (Signed)
Verlon AuLeslie, please advise on status of PA.

## 2013-12-14 NOTE — Telephone Encounter (Signed)
Called [employer]- Apparently they never received PA request for protonix  Do you wish to proceed with the PA process all over again, or can the pt try something OTC- ? Zegerid, nexium, prilosec? Please advise thanks!!

## 2013-12-14 NOTE — Telephone Encounter (Signed)
Ok to use otc any of the above

## 2013-12-15 NOTE — Telephone Encounter (Signed)
LMOM x 1 

## 2013-12-29 ENCOUNTER — Encounter: Payer: Self-pay | Admitting: Internal Medicine

## 2013-12-29 ENCOUNTER — Encounter (INDEPENDENT_AMBULATORY_CARE_PROVIDER_SITE_OTHER): Payer: Self-pay

## 2013-12-29 ENCOUNTER — Ambulatory Visit (INDEPENDENT_AMBULATORY_CARE_PROVIDER_SITE_OTHER): Payer: 59 | Admitting: Internal Medicine

## 2013-12-29 VITALS — BP 118/80 | HR 60 | Temp 97.9°F | Ht 67.0 in | Wt 203.0 lb

## 2013-12-29 DIAGNOSIS — R058 Other specified cough: Secondary | ICD-10-CM

## 2013-12-29 DIAGNOSIS — R059 Cough, unspecified: Secondary | ICD-10-CM

## 2013-12-29 DIAGNOSIS — I10 Essential (primary) hypertension: Secondary | ICD-10-CM

## 2013-12-29 DIAGNOSIS — R05 Cough: Secondary | ICD-10-CM

## 2013-12-29 MED ORDER — METHYLPREDNISOLONE ACETATE 80 MG/ML IJ SUSP
120.0000 mg | Freq: Once | INTRAMUSCULAR | Status: AC
  Administered 2013-12-29: 120 mg via INTRAMUSCULAR

## 2013-12-29 NOTE — Patient Instructions (Addendum)
Depomedrol today   Add 2 chloretrimeton at bedtime   If still coughing then take the cough medication you already have x 3 straight days to get 100% cough suppression   Pantoprazole   Take 30-60 min before first meal of the day and Pepcid 20 mg one bedtime until cough is completely gone for at least a week without the need for cough suppression  If not 100% better please return with all active medications in hand

## 2013-12-29 NOTE — Progress Notes (Signed)
Subjective:    Patient ID: Tricia Ford, female    DOB: 01-07-1971  MRN: [id number]    Brief patient profile:  [name]  42 yowf never smoker, exp to mother smoking, "always" sickly as child with pna age 43 hosp and missed a bunch of school, never able to keep up in sports then since around  2008 freq flares dx as bronchitis/ pna placed on acei around around 2013 and has not recovered from last episode from flu early Dec 2014 with lingular pna on cxr 05/31/13.   11/17/2013 1st [facility] office visit/ Tricia Ford / on ACEi Chief Complaint  Patient presents with  . PULMONARY CONSULT    Self referral- h/o PNA, wheezing, SOB. Cxr 05/31/14. Completed zpak x 5 days ago for URI.   baseline =  sob walking and talking but sometimes even at rest if talking  assoc with voice fatigue/ hoarseness Cough sporadic/ mostly daytimes, sometimes severe, always dry  with  assoc sore throat at times esp at end of day  Only thing that makes it better is narcotic cough med. Already on levaquin and no better  rec Pantoprazole (protonix) 40 mg   Take 30-60 min before first meal of the day and Pepcid 20 mg one bedtime until return to office - this is the best way to tell whether stomach acid is contributing to your problem.   Stop lisinopril GERD   Best cough med is delsym 2 tsp every 12 hours but if not effective and you can afford to be drowsy go ahead and use your narcotic containing cough med For drainage sensation/ tickle in throat  take chlortrimeton (chlorpheniramine) 4 mg every 4 hours available over the counter (may cause drowsiness)  Pneumovax today Please schedule a follow up office visit in 2 weeks, sooner if needed with all active meds in hand to regroup  12/04/2013 f/u ov/Tricia Ford re: cough/ did not bring meds / did not purchase chlorpheniramine as rec Chief Complaint  Patient presents with  . Follow-up    Pt reports her cough is some better, but not yet resolved. No new co's today.   Not  limited by breathing from desired activities   Cough dry, daytime constant sense of drainage but no excess mucus  rec Pantoprazole (protonix) 40 mg   Take 30-60 min before first meal of the day and Pepcid 20 mg one bedtime until return to office -    GERD diet Best cough med is delsym 2 tsp every 12 hours but if not effective and you can afford to be drowsy go ahead and use your narcotic containing cough med For drainage sensation/ tickle in throat  take chlortrimeton     12/29/2013 f/u ov/Tricia Ford re: chronic cough/ never 100% resolved since onset, never took h1 or narc Did not bring meds   Chief Complaint  Patient presents with  . Follow-up    Pt states that her cough has improved since the last visit. She has noticed some increased PND for the past 2-3 days.       cough esp at hs now despite pepcid at hs    No obvious day to day or daytime variabilty or assoc sob  cp or chest tightness, subjective wheeze overt sinus or hb symptoms. No unusual exp hx or h/o childhood pna/ asthma or knowledge of premature birth.  Sleeping ok without nocturnal  or early am exacerbation  of respiratory  c/o's or need for noct saba. Also denies any obvious fluctuation  of symptoms with weather or environmental changes or other aggravating or alleviating factors except as outlined above   Current Medications, Allergies, Complete Past Medical History, Past Surgical History, Family History, and Social History were reviewed in [facility] electronic medical record.  ROS  The following are not active complaints unless bolded sore throat, dysphagia, dental problems, itching, sneezing,  nasal congestion or excess/ purulent secretions, ear ache,   fever, chills, sweats, unintended wt loss, pleuritic or exertional cp, hemoptysis,  orthopnea pnd or leg swelling, presyncope, palpitations, heartburn, abdominal pain, anorexia, nausea, vomiting, diarrhea  or change in bowel or urinary habits, change in stools or urine,  dysuria,hematuria,  rash, arthralgias, visual complaints, headache, numbness weakness or ataxia or problems with walking or coordination,  change in mood/affect or memory.                         Objective:   Physical Exam  . Wt Readings from Last 3 Encounters:  12/29/13 203 lb (92.08 kg)  12/04/13 215 lb (97.523 kg)  11/17/13 214 lb 12.8 oz (97.433 kg)            Pleasant health appearing wf with no voice fatigue  HEENT: nl dentition, turbinates, and orophanx. Nl external ear canals without cough reflex   NECK :  without JVD/Nodes/TM/ nl carotid upstrokes bilaterally   LUNGS: no acc muscle use, clear to A and P bilaterally without cough on insp or exp maneuvers   CV:  RRR  no s3 or murmur or increase in P2, no edema   ABD:  soft and nontender with nl excursion in the supine position. No bruits or organomegaly, bowel sounds nl  MS:  warm without deformities, calf tenderness, cyanosis or clubbing  SKIN: warm and dry without lesions          cxr 05/31/14 Lingula versus anterior upper lobe pneumonia. Post treatment  radiographs recommended to document resolution.  CXR  11/17/2013 :  No active cardiopulmonary disease.     Assessment & Plan:

## 2013-12-30 NOTE — Assessment & Plan Note (Signed)
Trial off acei 11/17/13 due to cough/sob  Adequate control on present rx, reviewed > no change in rx needed  > avoid off acei indefinitely

## 2013-12-30 NOTE — Assessment & Plan Note (Signed)
-   dc'd  acei  11/17/2013   Still strongly favor uacs here.   Classic Upper airway cough syndrome, so named because it's frequently impossible to sort out how much is  CR/sinusitis with freq throat clearing (which can be related to primary GERD)   vs  causing  secondary (" extra esophageal")  GERD from wide swings in gastric pressure that occur with throat clearing, often  promoting self use of mint and menthol lozenges that reduce the lower esophageal sphincter tone and exacerbate the problem further in a cyclical fashion.   These are the same pts (now being labeled as having "irritable larynx syndrome" by some cough centers) who not infrequently have a history of having failed to tolerate ace inhibitors,  dry powder inhalers or biphosphonates or report having atypical reflux symptoms that don't respond to standard doses of PPI , and are easily confused as having aecopd or asthma flares by even experienced allergists/ pulmonologists.   rec continue off acei, add 1st gen h1, eliminate cyclical coughing  Also reviewed: The standardized cough guidelines published in Chest by [employer] in 2006 are still the best available and consist of a multiple step process (up to 12!) , not a single office visit,  and are intended  to address this problem logically,  with an alogrithm dependent on response to empiric treatment at  each progressive step  to determine a specific diagnosis with  minimal addtional testing needed. Therefore if adherence is an issue or can't be accurately verified,  it's very unlikely the standard evaluation and treatment will be successful here.    Furthermore, response to therapy (other than acute cough suppression, which should only be used short term with avoidance of narcotic containing cough syrups if possible), can be a gradual process for which the patient may perceive immediate benefit.    If not better should return with all meds in hand to use a trust but verify approach before  additional  Steps  See instructions for specific recommendations which were reviewed directly with the patient who was given a copy with highlighter outlining the key components.

## 2014-04-02 ENCOUNTER — Ambulatory Visit (INDEPENDENT_AMBULATORY_CARE_PROVIDER_SITE_OTHER): Payer: 59 | Admitting: Internal Medicine

## 2014-04-02 ENCOUNTER — Encounter: Payer: Self-pay | Admitting: Internal Medicine

## 2014-04-02 VITALS — BP 160/100 | HR 86 | Ht 67.0 in | Wt 212.0 lb

## 2014-04-02 DIAGNOSIS — R058 Other specified cough: Secondary | ICD-10-CM

## 2014-04-02 DIAGNOSIS — R0600 Dyspnea, unspecified: Secondary | ICD-10-CM

## 2014-04-02 DIAGNOSIS — R05 Cough: Secondary | ICD-10-CM

## 2014-04-02 DIAGNOSIS — I10 Essential (primary) hypertension: Secondary | ICD-10-CM

## 2014-04-02 NOTE — Patient Instructions (Signed)
Diagnosis is upper airway cough syndrome caused by post nasal drainage/ reflux/ acei inhibitors or anxiety (any one or in combination)   Discuss with your cardiologist 1) permanently stopping the ace inhibitors (best choice from my perspective is beta blockers as there is no asthma here) 2) regular aerobic exercise once your have cleared from cardiology perspective - we have 3 electrophysiologists here

## 2014-04-02 NOTE — Progress Notes (Signed)
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   11/17/2013 1st [facility] office visit/ Tricia Ford / on ACEi Chief Complaint  Patient presents with  . PULMONARY CONSULT    Self referral- h/o PNA, wheezing, SOB. Cxr 05/31/14. Completed zpak x 5 days ago for URI.   baseline =  sob walking and talking but sometimes even at rest if talking  assoc with voice fatigue/ hoarseness Cough sporadic/ mostly day times, sometimes severe, always dry  with  assoc sore throat at times esp at end of day  Only thing that makes it better is narcotic cough med. Already on levaquin and no better  rec Pantoprazole (protonix) 40 mg   Take 30-60 min before first meal of the day and Pepcid 20 mg one bedtime until return to office - this is the best way to tell whether stomach acid is contributing to your problem.   Stop lisinopril GERD   Best cough med is delsym 2 tsp every 12 hours but if not effective and you can afford to be drowsy go ahead and use your narcotic containing cough med For drainage sensation/ tickle in throat  take chlortrimeton (chlorpheniramine) 4 mg every 4 hours available over the counter (may cause drowsiness)  Pneumovax today Please schedule a follow up office visit in 2 weeks, sooner if needed with all active meds in hand to regroup  12/04/2013 f/u ov/Tricia Ford re: cough/ did not bring meds / did not purchase chlorpheniramine as rec Chief Complaint  Patient presents with  . Follow-up    Pt reports her cough is some better, but not yet resolved. No new co's today.   Not  limited by breathing from desired activities   Cough dry, daytime constant sense of drainage but no excess mucus  rec Pantoprazole (protonix) 40 mg   Take 30-60 min before first meal of the day and Pepcid 20 mg one bedtime until return to office -    GERD diet Best cough med is delsym 2 tsp every 12 hours but if not effective and you can afford to be drowsy go ahead and use your narcotic containing cough med For drainage sensation/ tickle in throat  take chlortrimeton     12/29/2013 f/u ov/Tricia Ford re: chronic cough/ never 100% resolved since onset, never took h1 or narc Did not bring meds   Chief Complaint  Patient presents with  . Follow-up    Pt states that her cough has improved since the last visit. She has noticed some increased PND for the past 2-3 days.    cough esp at hs now despite pepcid at hs  rec Depomedrol today  Add 2 chloretrimeton at bedtime  If still coughing then take the cough medication you already have x 3 straight days to get 100% cough suppression  Protonix Take 30-60 min before first meal of the day and Pepcid 20 mg one bedtime until cough is completely gone for at least a week without the need for cough suppression  04/02/2014 f/u ov/Tricia Ford re: recurrent sob Chief Complaint  Patient presents with  . Follow-up    Pt c/o increased SOB since beginning of Sept 2015-seeing cards- Dr Sampson GoonFitzgerald.  Cough comes and goes and is non prod.   was doing really well one month prior to OV  Not on acei / still on protonix and pepcid with no cough or sob then onset sob / palpitations s cough then started back on ACEi and now looses breath at times sitting still if talking/  Cough started back while   on ppi/pepcid consistently with no sense of pnds  Sense of wheeze when lies down has also developed while on acei    No obvious day to day or daytime variabilty or assoc   cp or chest tightness, subjective wheeze overt sinus or hb symptoms. No unusual exp hx or h/o childhood pna/  asthma or knowledge of premature birth.  Sleeping ok without nocturnal  or early am exacerbation  of respiratory  c/o's or need for noct saba. Also denies any obvious fluctuation of symptoms with weather or environmental changes or other aggravating or alleviating factors except as outlined above   Current Medications, Allergies, Complete Past Medical History, Past Surgical History, Family History, and Social History were reviewed in [facility] electronic medical record.  ROS  The following are not active complaints unless bolded sore throat, dysphagia, dental problems, itching, sneezing,  nasal congestion or excess/ purulent secretions, ear ache,   fever, chills, sweats, unintended wt loss, pleuritic or exertional cp, hemoptysis,  orthopnea pnd or leg swelling, presyncope, palpitations, heartburn, abdominal pain, anorexia, nausea, vomiting, diarrhea  or change in bowel or urinary habits, change in stools or urine, dysuria,hematuria,  rash, arthralgias, visual complaints, headache, numbness weakness or ataxia or problems with walking or coordination,  change in mood/affect or memory.                         Objective:   Physical Exam  04/02/2014       212  Wt Readings from Last 3 Encounters:  12/29/13 203 lb (92.08 kg)  12/04/13 215 lb (97.523 kg)  11/17/13 214 lb 12.8 oz (97.433 kg)            Pleasant health appearing wf very anxious she has pulmonary hypertension after what she read about it   HEENT: nl dentition, turbinates, and orophanx. Nl external ear canals without cough reflex   NECK :  without JVD/Nodes/TM/ nl carotid upstrokes bilaterally   LUNGS: no acc muscle use, clear to A and P bilaterally without cough on insp or exp maneuvers   CV:  RRR  no s3 or murmur or increase in P2, no edema   ABD:  soft and nontender with nl excursion in the supine position. No bruits or organomegaly, bowel sounds nl  MS:  warm without deformities, calf tenderness, cyanosis  or clubbing  SKIN: warm and dry without lesions       cxr 05/31/14 Lingula versus anterior upper lobe pneumonia. Post treatment  radiographs recommended to document resolution.  CXR  11/17/2013 :  No active cardiopulmonary disease.     Assessment & Plan:

## 2014-04-03 DIAGNOSIS — R0600 Dyspnea, unspecified: Secondary | ICD-10-CM | POA: Insufficient documentation

## 2014-04-03 NOTE — Assessment & Plan Note (Signed)
-   dc'd  acei  11/17/2013  And cough resolved>>> And rec d/c again now   I really see no indication for acei here vs alternatives that don't muddy the water in terms of interpreting her non-specific resp symptoms which are typical of uacs with ACEi at the very top of the short list of causes   No need to return here unless symptoms persist p 4 weeks minimum off acei

## 2014-04-03 NOTE — Assessment & Plan Note (Signed)
Trial off acei 11/17/13 due to cough/sob  Strongly rec BB due to anxiety/ palpitations but defer final call to cards

## 2014-04-03 NOTE — Assessment & Plan Note (Signed)
-   04/02/2014  Walked RA x 3 laps @ 185 ft each stopped due to  End of study, nl pace, only sob when talking   Symptoms are markedly disproportionate to objective findings and not clear this is a lung problem but pt does appear to have difficult airway management issues. DDX of  difficult airways management all start with A and  include Adherence, Ace Inhibitors, Acid Reflux, Active Sinus Disease, Alpha 1 Antitripsin deficiency, Anxiety masquerading as Airways dz,  ABPA,  allergy(esp in young), Aspiration (esp in elderly), Adverse effects of DPI,  Active smokers, plus two Bs  = Bronchiectasis and Beta blocker use..and one C= CHF  Adherence is always the initial "prime suspect" and is a multilayered concern that requires a "trust but verify" approach in every patient - starting with knowing how to use medications, especially inhalers, correctly, keeping up with refills and understanding the fundamental difference between maintenance and prns vs those medications only taken for a very short course and then stopped and not refilled.  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

## 2014-04-05 ENCOUNTER — Telehealth: Payer: Self-pay | Admitting: Internal Medicine

## 2014-04-05 NOTE — Telephone Encounter (Signed)
Spoke with pt. She wanted to know the dates she had depo medrol injection. I advised her 12/04/13 and 12/29/13. She needed nothing further

## 2014-04-17 ENCOUNTER — Other Ambulatory Visit: Payer: Self-pay | Admitting: Internal Medicine

## 2014-06-19 ENCOUNTER — Other Ambulatory Visit: Payer: Self-pay | Admitting: Internal Medicine

## 2014-07-16 ENCOUNTER — Ambulatory Visit: Payer: Self-pay | Admitting: Internal Medicine

## 2014-07-18 ENCOUNTER — Ambulatory Visit (INDEPENDENT_AMBULATORY_CARE_PROVIDER_SITE_OTHER): Payer: Medicare Other | Admitting: Internal Medicine

## 2014-07-18 ENCOUNTER — Ambulatory Visit (INDEPENDENT_AMBULATORY_CARE_PROVIDER_SITE_OTHER)
Admission: RE | Admit: 2014-07-18 | Discharge: 2014-07-18 | Disposition: A | Discharge status: Home / Self Care | Payer: 59 | Source: Ambulatory Visit | Attending: Internal Medicine | Admitting: Internal Medicine

## 2014-07-18 ENCOUNTER — Telehealth: Payer: Self-pay | Admitting: Internal Medicine

## 2014-07-18 ENCOUNTER — Encounter: Payer: Self-pay | Admitting: Internal Medicine

## 2014-07-18 VITALS — BP 160/96 | HR 86 | Temp 98.1°F | Ht 68.0 in | Wt 214.8 lb

## 2014-07-18 DIAGNOSIS — R05 Cough: Secondary | ICD-10-CM

## 2014-07-18 DIAGNOSIS — I10 Essential (primary) hypertension: Secondary | ICD-10-CM

## 2014-07-18 DIAGNOSIS — R058 Other specified cough: Secondary | ICD-10-CM

## 2014-07-18 MED ORDER — PREDNISONE 10 MG PO TABS: ORAL_TABLET | ORAL | Status: DC

## 2014-07-18 MED ORDER — TRAMADOL HCL 50 MG PO TABS: ORAL_TABLET | ORAL | Status: DC

## 2014-07-18 NOTE — Patient Instructions (Addendum)
Instead of losartan,  Strongly prefer in this setting: Bystolic, the most beta -1  selective Beta blocker available in sample form, with bisoprolol the most selective generic choice  on the market  The key to effective treatment for your cough is eliminating the non-stop cycle of cough you're stuck in long enough to let your airway heal completely and then see if there is anything still making you cough once you stop the cough suppression, but this should take no more than 5 days to figure out  First take delsym two tsp every 12 hours and supplement if needed with  tramadol 50 mg up to 2 every 4 hours to suppress the urge to cough at all or even clear your throat. Swallowing water or using ice chips/non mint and menthol containing candies (such as lifesavers or sugarless jolly ranchers) are also effective.  You should rest your voice and avoid activities that you know make you cough.  Once you have eliminated the cough for 3 straight days try reducing the tramadol first,  then the delsym as tolerated.    Prednisone 10 mg take  4 each am x 2 days,   2 each am x 2 days,  1 each am x 2 days and stop (this is to eliminate allergies and inflammation from coughing)  Protonix (pantoprazole) 40 mg Take 30-60 min before first meal of the day and Pepcid 20 mg one bedtime plus chlorpheniramine 4 mg x 2 at bedtime (both available over the counter)  until cough is completely gone for at least a week without the need for cough suppression  GERD (REFLUX)  is an extremely common cause of respiratory symptoms, many times with no significant heartburn at all.    It can be treated with medication, but also with lifestyle changes including avoidance of late meals, excessive alcohol, smoking cessation, and avoid fatty foods, chocolate, peppermint, colas, red wine, and acidic juices such as orange juice.  NO MINT OR MENTHOL PRODUCTS SO NO COUGH DROPS  USE HARD CANDY INSTEAD (jolley ranchers or Stover's or Lifesavers (all  available in sugarless versions) NO OIL BASED VITAMINS - use powdered substitutes.  Please remember to go to the  x-ray department downstairs for your tests - we will call you with the results when they are available.     Return in 2 weeks if not better with all active meds in hand

## 2014-07-18 NOTE — Progress Notes (Signed)
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    History of Present Illness  11/17/2013 1st [facility] office visit/ Tricia Ford / on ACEi Chief Complaint  Patient presents with  . PULMONARY CONSULT    Self referral- h/o PNA, wheezing, SOB. Cxr 05/31/14. Completed zpak x 5 days ago for URI.   baseline =  sob walking and talking but sometimes even at rest if talking  assoc with voice fatigue/ hoarseness Cough sporadic/ mostly day times, sometimes severe, always dry  with  assoc sore throat at times esp at end of day  Only thing that makes it better is narcotic cough med. Already on levaquin and no better  rec Pantoprazole (protonix) 40 mg   Take 30-60 min before first meal of the day and Pepcid 20 mg one bedtime until return to office - this is the best way to tell whether stomach acid is contributing to your problem.   Stop lisinopril GERD   Best cough med is delsym 2 tsp every 12 hours but if not effective and you can afford to be drowsy go ahead and use your narcotic containing cough med For drainage sensation/ tickle in throat  take chlortrimeton (chlorpheniramine) 4 mg every 4 hours available over the counter (may cause drowsiness)  Pneumovax today Please schedule a follow up office visit in 2 weeks, sooner if needed with all active meds in hand to regroup  12/04/2013 f/u ov/Tricia Ford re: cough/ did not bring meds / did not purchase chlorpheniramine as rec Chief Complaint  Patient presents with  . Follow-up    Pt reports her cough is some better, but not yet resolved.  No new co's today.   Not limited by breathing from desired activities   Cough dry, daytime constant sense of drainage but no excess mucus  rec Pantoprazole (protonix) 40 mg   Take 30-60 min before first meal of the day and Pepcid 20 mg one bedtime until return to office -    GERD diet Best cough med is delsym 2 tsp every 12 hours but if not effective and you can afford to be drowsy go ahead and use your narcotic containing cough med For drainage sensation/ tickle in throat  take chlortrimeton     12/29/2013 f/u ov/Tricia Ford re: chronic cough/ never 100% resolved since onset, never took h1 or narc Did not bring meds   Chief Complaint  Patient presents with  . Follow-up    Pt states that her cough has improved since the last visit. She has noticed some increased PND for the past 2-3 days.    cough esp at hs now despite pepcid at hs  rec Depomedrol today  Add 2 chloretrimeton at bedtime  If still coughing then take the cough medication you already have x 3 straight days to get 100% cough suppression  Protonix Take 30-60 min before first meal of the day and Pepcid 20 mg one bedtime until cough is completely gone for at least a week without the  need for cough suppression    04/02/2014 f/u ov/Tricia Ford re: recurrent sob back on ACei  Chief Complaint  Patient presents with  . Follow-up    Pt c/o increased SOB since beginning of Sept 2015-seeing Ford- Tricia Ford.  Cough comes and goes and is non prod.   was doing really well one month prior to OV  Not on acei / still on protonix and pepcid with no cough or sob then onset sob / palpitations s cough then started back on ACEi and now looses breath at times sitting still if talking/  Cough started back while   on ppi/pepcid consistently with no sense of pnds Sense of wheeze when lies down has also developed while on acei rec Diagnosis is upper airway cough syndrome caused by post nasal drainage/ reflux/ acei inhibitors or anxiety (any one or in  combination) Discuss with your cardiologist 1) permanently stopping the ace inhibitors (best choice from my perspective is beta blockers as there is no asthma here) 2) regular aerobic exercise once your have cleared from cardiology perspective - we have 3 electrophysiologists here    07/18/2014 f/u ov/Tricia Ford re:  Upper airway cough off acei since mid nov 2015  Chief Complaint  Patient presents with  . Follow-up    Pt states was seen by her PCP 1 wk ago and was dxed with bronchitis and URI. She c/o cough- non prod- taking ceftin currently.   was 70% better p change from ACEi to arb prior to abrupt cough in setting of what she though was a typical head cold  On a 10 d course of ceftin some better  Cough is worst at hs better, some better with  head elevated     No obvious day to day or daytime variabilty or assoc   cp or chest tightness, subjective wheeze overt sinus or hb symptoms. No unusual exp hx or h/o childhood pna/ asthma or knowledge of premature birth.  Sleeping ok without nocturnal  or early am exacerbation  of respiratory  c/o's or need for noct saba. Also denies any obvious fluctuation of symptoms with weather or environmental changes or other aggravating or alleviating factors except as outlined above   Current Medications, Allergies, Complete Past Medical History, Past Surgical History, Family History, and Social History were reviewed in [facility] record.  ROS  The following are not active complaints unless bolded sore throat, dysphagia, dental problems, itching, sneezing,  nasal congestion or excess/ purulent secretions, ear ache,   fever, chills, sweats, unintended wt loss, pleuritic or exertional cp, hemoptysis,  orthopnea pnd or leg swelling, presyncope, palpitations, heartburn, abdominal pain, anorexia, nausea, vomiting, diarrhea  or change in bowel or urinary habits, change in stools or urine, dysuria,hematuria,  rash, arthralgias, visual complaints,  headache, numbness weakness or ataxia or problems with walking or coordination,  change in mood/affect or memory.                         Objective:   Physical Exam  04/02/2014       212 >    07/18/2014  215  Wt Readings from Last 3 Encounters:  12/29/13 203 lb (92.08 kg)  12/04/13 215 lb (97.523 kg)  11/17/13 214 lb 12.8 oz (97.433 kg)         Pleasant health appearing wf very anxious / fever blisters upper lip  HEENT: nl dentition, turbinates, and orophanx. Nl external ear canals without cough reflex  NECK :  without JVD/Nodes/TM/ nl carotid upstrokes bilaterally   LUNGS: no acc muscle use, clear to A and P bilaterally without cough on insp or exp maneuvers   CV:  RRR  no s3 or murmur or increase in P2, no edema   ABD:  soft and nontender with nl excursion in the supine position. No bruits or organomegaly, bowel sounds nl  MS:  warm without deformities, calf tenderness, cyanosis or clubbing  SKIN: warm and dry without lesions       CXR PA and Lateral:   07/18/2014 :     I personally reviewed images and agree with radiology impression as follows:     Normal exam.    Assessment & Plan:

## 2014-07-18 NOTE — Telephone Encounter (Signed)
I spoke with patient about results and she verbalized understanding and had no questions 

## 2014-07-18 NOTE — Telephone Encounter (Signed)
Nl cxr

## 2014-07-18 NOTE — Telephone Encounter (Signed)
Pt had CXR done today 07/18/14. Pt requesting results. Please advise MW thanks

## 2014-07-22 ENCOUNTER — Encounter: Payer: Self-pay | Admitting: Internal Medicine

## 2014-07-22 NOTE — Assessment & Plan Note (Signed)
Strongly prefer in this setting: Bystolic, the most beta -1  selective Beta blocker available in sample form, with bisoprolol the most selective generic choice  on the market.   Note the arb generic losartan is not working very well and does have reported cough reported with its use.

## 2014-07-22 NOTE — Assessment & Plan Note (Addendum)
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   These are the same pts (now being labeled as having "irritable larynx syndrome" by some cough centers) who not infrequently have a history of having failed to tolerate ace inhibitors (which she proved not once but twice) ,  dry powder inhalers or biphosphonates or report having atypical reflux symptoms that don't respond to standard doses of PPI , and are easily confused as having aecopd or asthma flares by even experienced allergists/ pulmonologists.  unfortnately this pattern of cough has now been described also with generic cozar but not with the other arbs For reasons that may related to vascular permability and nitric oxide pathways but not elevated  bradykinin levels (as seen with  ACEi use) losartan in the generic form has been reported now from mulitple sources  to cause a similar pattern of non-specific  upper airway symptoms as seen with acei.   This has not been reported with exposure to the other ARB's to date, so it seems reasonable for now to try either generic diovan or avapro if ARB needed or use an alternative class altogether.  See:  Dewayne [facility] [facility]ol  2008: 101: p 495-499    See hbp  In meantime rec rechallenge with cough elimination protocol then regroup in 2 weeks if not better but ask her to return for a trust but verify visit where we make absolutely sure she's doing what we ask her to do before we ask her to do more.

## 2014-07-24 ENCOUNTER — Other Ambulatory Visit: Payer: Self-pay | Admitting: Internal Medicine

## 2014-07-25 ENCOUNTER — Other Ambulatory Visit: Payer: Self-pay | Admitting: Internal Medicine

## 2014-07-25 MED ORDER — PANTOPRAZOLE SODIUM 40 MG PO TBEC: DELAYED_RELEASE_TABLET | ORAL | Status: DC

## 2014-08-01 ENCOUNTER — Ambulatory Visit: Payer: 59 | Admitting: Internal Medicine

## 2014-09-03 ENCOUNTER — Other Ambulatory Visit: Payer: Self-pay | Admitting: Internal Medicine

## 2016-07-02 ENCOUNTER — Encounter (HOSPITAL_BASED_OUTPATIENT_CLINIC_OR_DEPARTMENT_OTHER): Payer: Self-pay | Admitting: *Deleted

## 2016-07-02 ENCOUNTER — Emergency Department (HOSPITAL_BASED_OUTPATIENT_CLINIC_OR_DEPARTMENT_OTHER)
Admission: EM | Admit: 2016-07-02 | Discharge: 2016-07-02 | Disposition: A | Discharge status: Home / Self Care | Payer: 59 | Attending: Emergency Medicine | Admitting: Emergency Medicine

## 2016-07-02 ENCOUNTER — Emergency Department (HOSPITAL_BASED_OUTPATIENT_CLINIC_OR_DEPARTMENT_OTHER): Payer: 59

## 2016-07-02 DIAGNOSIS — I10 Essential (primary) hypertension: Secondary | ICD-10-CM | POA: Insufficient documentation

## 2016-07-02 DIAGNOSIS — R109 Unspecified abdominal pain: Secondary | ICD-10-CM | POA: Insufficient documentation

## 2016-07-02 DIAGNOSIS — M549 Dorsalgia, unspecified: Secondary | ICD-10-CM | POA: Insufficient documentation

## 2016-07-02 LAB — BASIC METABOLIC PANEL
Anion gap: 7 (ref 5–15)
BUN: 10 mg/dL (ref 6–20)
CHLORIDE: 105 mmol/L (ref 101–111)
CO2: 25 mmol/L (ref 22–32)
CREATININE: 0.78 mg/dL (ref 0.44–1.00)
Calcium: 9 mg/dL (ref 8.9–10.3)
GFR calc non Af Amer: >60 mL/min (ref >60)
GLUCOSE: 87 mg/dL (ref 65–99)
Potassium: 3.6 mmol/L (ref 3.5–5.1)
Sodium: 137 mmol/L (ref 135–145)

## 2016-07-02 LAB — CBC WITH DIFFERENTIAL/PLATELET
BASOS PCT: 0 %
Basophils Absolute: 0 10*3/uL (ref 0.0–0.1)
EOS PCT: 1 %
Eosinophils Absolute: 0.1 10*3/uL (ref 0.0–0.7)
HCT: 40.6 % (ref 36.0–46.0)
Hemoglobin: 14.1 g/dL (ref 12.0–15.0)
LYMPHS ABS: 1.9 10*3/uL (ref 0.7–4.0)
Lymphocytes Relative: 18 %
MCH: 31.3 pg (ref 26.0–34.0)
MCHC: 34.7 g/dL (ref 30.0–36.0)
MCV: 90.2 fL (ref 78.0–100.0)
MONO ABS: 0.7 10*3/uL (ref 0.1–1.0)
Monocytes Relative: 7 %
Neutro Abs: 7.6 10*3/uL (ref 1.7–7.7)
Neutrophils Relative %: 74 %
Platelets: 320 10*3/uL (ref 150–400)
RBC: 4.5 MIL/uL (ref 3.87–5.11)
RDW: 12.9 % (ref 11.5–15.5)
WBC: 10.3 10*3/uL (ref 4.0–10.5)

## 2016-07-02 LAB — URINALYSIS, ROUTINE W REFLEX MICROSCOPIC
BILIRUBIN URINE: NEGATIVE
Glucose, UA: NEGATIVE mg/dL
Hgb urine dipstick: NEGATIVE
Ketones, ur: NEGATIVE mg/dL
Leukocytes, UA: NEGATIVE
Nitrite: NEGATIVE
Protein, ur: NEGATIVE mg/dL
Specific Gravity, Urine: 1.015 (ref 1.005–1.030)
pH: 6 (ref 5.0–8.0)

## 2016-07-02 MED ORDER — MORPHINE SULFATE (PF) 4 MG/ML IV SOLN
4.0000 mg | Freq: Once | INTRAVENOUS | Status: AC
  Administered 2016-07-02: 4 mg via INTRAVENOUS
  Filled 2016-07-02: qty 1

## 2016-07-02 MED ORDER — TRAMADOL HCL 50 MG PO TABS: 50.0000 mg | ORAL_TABLET | Freq: Four times a day (QID) | ORAL | 0 refills | Status: DC | PRN

## 2016-07-02 MED ORDER — KETOROLAC TROMETHAMINE 30 MG/ML IJ SOLN
30.0000 mg | Freq: Once | INTRAMUSCULAR | Status: AC
  Administered 2016-07-02: 30 mg via INTRAVENOUS
  Filled 2016-07-02: qty 1

## 2016-07-02 MED ORDER — ONDANSETRON HCL 4 MG/2ML IJ SOLN
4.0000 mg | Freq: Once | INTRAMUSCULAR | Status: AC
  Administered 2016-07-02: 4 mg via INTRAVENOUS
  Filled 2016-07-02: qty 2

## 2016-07-02 MED ORDER — PROMETHAZINE HCL 25 MG/ML IJ SOLN
12.5000 mg | Freq: Once | INTRAMUSCULAR | Status: AC
  Administered 2016-07-02: 12.5 mg via INTRAVENOUS
  Filled 2016-07-02: qty 1

## 2016-07-02 MED ORDER — NAPROXEN 500 MG PO TABS: 500.0000 mg | ORAL_TABLET | Freq: Two times a day (BID) | ORAL | 0 refills | Status: DC

## 2016-07-02 MED FILL — NAPROXEN 500 MG TABLET: 500 | 10 days supply | Qty: 20 | Fill #0

## 2016-07-02 MED FILL — traMADol HCL 50 MG TABS: 50 | 12 days supply | Qty: 12 | Fill #0

## 2016-07-02 NOTE — ED Triage Notes (Signed)
C/o upper left rib pain on back since Wednesday. Hx of kidney stones. Has seen MD for same. Also c/o nausea.

## 2016-07-02 NOTE — Discharge Instructions (Signed)
Naproxen as prescribed.  Tramadol as prescribed as needed for pain not relieved with naproxen.  Follow up with your primary Dr. if not improving in the next week, and return to the ER if you develop any new and concerning symptoms.

## 2016-07-02 NOTE — ED Provider Notes (Signed)
[facility] Provider Note   CSN: [id number] Arrival date & time: 07/02/16  1013     History   Chief Complaint Chief Complaint  Patient presents with  . Back Pain    HPI Tricia Ford is a 46 y.o. female.  Patient is a 46 year old female with history of renal calculi. She presents today with left flank pain for the past week. This began in the absence of any injury or trauma. She has been seen by her primary Dr. and was initially given the diagnosis of musculoskeletal pain. She went back to the doctor's office yesterday and urinalysis revealed white cells and blood. It would be several days before a CT could be approved by the primary doctor's office, so she presents here for evaluation. She is complaining of ongoing pain in the left flank that radiates to the left upper quadrant. She feels nauseated but has not vomited. She denies any constipation or diarrhea.   The history is provided by the patient.  Back Pain   This is a new problem. Episode onset: 1 week ago. The problem occurs constantly. The problem has been gradually worsening. The pain is associated with no known injury. Pain location: Left flank. Radiates to: Left upper abdomen. The pain is moderate. Pertinent negatives include no fever, no bowel incontinence, no dysuria and no weakness.    Past Medical History:  Diagnosis Date  . Anxiety   . Borderline diabetes   . Gilbert syndrome   . Goiter   . HSV infection   . Hyperlipidemia   . Hypertension   . Kidney stone   . Lingular pneumonia   . Liver cirrhosis secondary to NASH (nonalcoholic steatohepatitis) (HCC)   . Liver disease   . Metabolic syndrome   . Migraine   . Multinodular goiter   . MVP (mitral valve prolapse)   . PAC (premature atrial contraction)   . Ruptured ovarian cyst   . Serum ammonia increased (HCC)   . SVT (supraventricular tachycardia) (HCC)   . Thyroid disease   . Torn meniscus   . Vitamin D deficiency     Patient Active Problem  List   Diagnosis Date Noted  . Dyspnea 04/03/2014  . Essential hypertension 11/20/2013  . Upper airway cough syndrome 11/17/2013    Past Surgical History:  Procedure Laterality Date  . ABDOMINAL HYSTERECTOMY    . ABLATION    . CARDIAC CATHETERIZATION    . CARDIAC VALVE SURGERY     Ablation  . CESAREAN SECTION    . GALLBLADDER SURGERY    . KNEE CARTILAGE SURGERY    . liver biopsy    . POLYPECTOMY    . TONSILLECTOMY      OB History    No data available       Home Medications    Prior to Admission medications   Medication Sig Start Date End Date Taking? Authorizing Provider  cloNIDine (CATAPRES) 0.1 MG tablet Take 0.1 mg by mouth as needed (when BP is higher than 160).    Historical Provider, MD  famotidine (PEPCID) 20 MG tablet TAKE 1 TABLET NIGHTLY AT BEDTIME 04/17/14   Nyoka Cowden, MD  ibuprofen (ADVIL,MOTRIN) 200 MG tablet Take 800 mg by mouth every 6 (six) hours as needed for mild pain or moderate pain.    Historical Provider, MD  losartan (COZAAR) 50 MG tablet Take 1 tablet by mouth daily. 01/24/14   Historical Provider, MD  pantoprazole (PROTONIX) 40 MG tablet TAKE 1 TABLET 30-60 MINUTES  BEFORE FIRST MEAL OF THE DAY 07/25/14   Nyoka CowdenMichael B Wert, MD  predniSONE (DELTASONE) 10 MG tablet Take  4 each am x 2 days,   2 each am x 2 days,  1 each am x 2 days and stop 07/18/14   Nyoka CowdenMichael B Wert, MD  valACYclovir (VALTREX) 500 MG tablet Take 500 mg by mouth 2 (two) times daily as needed (fever blisters).    Historical Provider, MD  Vitamin D, Ergocalciferol, (DRISDOL) 50000 UNITS CAPS capsule Take 50,000 Units by mouth every 7 (seven) days.    Historical Provider, MD    Family History Family History  Problem Relation Age of Onset  . Emphysema Maternal Grandmother   . Clotting disorder Maternal Grandmother   . Rheum arthritis Maternal Grandmother   . Cancer Maternal Grandmother   . Emphysema Mother   . Heart disease Mother   . Cancer Mother   . Emphysema Other     uncles    . Allergies Other     multiple family members  . Asthma Brother   . Asthma Daughter   . Heart disease Brother   . Cancer Other     uncles    Social History Social History  Substance Use Topics  . Smoking status: Never Smoker  . Smokeless tobacco: Never Used  . Alcohol use No     Comment: rarely     Allergies   Zomig [zolmitriptan]   Review of Systems Review of Systems  Constitutional: Negative for fever.  Gastrointestinal: Negative for bowel incontinence.  Genitourinary: Negative for dysuria.  Musculoskeletal: Positive for back pain.  Neurological: Negative for weakness.  All other systems reviewed and are negative.    Physical Exam Updated Vital Signs BP (!) 144/106 (BP Location: Left Arm)   Pulse 74   Temp 97.8 F (36.6 C) (Oral)   Resp 20   Ht 5\' 7"  (1.702 m)   Wt 180 lb (81.6 kg)   SpO2 100%   BMI 28.19 kg/m   Physical Exam  Constitutional: She is oriented to person, place, and time. She appears well-developed and well-nourished. No distress.  HENT:  Head: Normocephalic and atraumatic.  Neck: Normal range of motion. Neck supple.  Cardiovascular: Normal rate and regular rhythm.  Exam reveals no gallop and no friction rub.   No murmur heard. Pulmonary/Chest: Effort normal and breath sounds normal. No respiratory distress. She has no wheezes.  Abdominal: Soft. Bowel sounds are normal. She exhibits no distension. There is no tenderness. There is no rebound and no guarding.  There is tenderness to palpation in the left flank and left upper quadrant.  Musculoskeletal: Normal range of motion.  Neurological: She is alert and oriented to person, place, and time.  Skin: Skin is warm and dry. She is not diaphoretic.  Nursing note and vitals reviewed.    ED Treatments / Results  Labs (all labs ordered are listed, but only abnormal results are displayed) Labs Reviewed  BASIC METABOLIC PANEL  CBC WITH DIFFERENTIAL/PLATELET    EKG  EKG  Interpretation None       Radiology No results found.  Procedures Procedures (including critical care time)  Medications Ordered in ED Medications  ondansetron (ZOFRAN) injection 4 mg (not administered)  morphine 4 MG/ML injection 4 mg (not administered)  ketorolac (TORADOL) 30 MG/ML injection 30 mg (not administered)     Initial Impression / Assessment and Plan / ED Course  I have reviewed the triage vital signs and the nursing notes.  Pertinent  labs & imaging results that were available during my care of the patient were reviewed by me and considered in my medical decision making (see chart for details).  Patient presents with left flank pain for the past week. She reports a history of renal calculi, however there is no hematuria, pyuria, and CT scan shows no evidence for calculus. CT scan also fails to show an alternate explanation for her symptoms. She appears very stable and is feeling better with medications given in the ER. I suspect a musculoskeletal etiology and believe she is appropriate for discharge.  Final Clinical Impressions(s) / ED Diagnoses   Final diagnoses:  None    New Prescriptions New Prescriptions   No medications on file     Geoffery Lyons, MD 07/02/16 1141

## 2018-03-08 IMAGING — CT CT RENAL STONE PROTOCOL
2 of 4 series · 17 of 46 positions shown, 19 images · non-contrast
Comparison: None.

CLINICAL DATA: Pt c/o left flank pain x 1 week with nausea and
vomiting since [REDACTED], pt has a history kidney stones, chole.,
ablation, left ovary and tube removed, liver biopsy, MVP, SVT, PAC,
liver dz., HTN, JE SUS infection

EXAM:
CT ABDOMEN AND PELVIS WITHOUT CONTRAST
TECHNIQUE: Multidetector CT imaging of the abdomen and pelvis was performed
following the standard protocol without IV contrast.

[Series 2: axial st · axial · 0.85mm/px · z∈[-553,-103]mm · 14 of 100 slices shown, 16 images]
[im 5/100  soft-tissue]
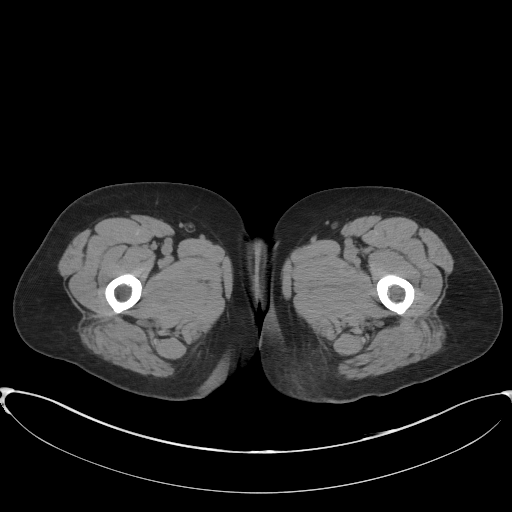
[im 5/100  bone]
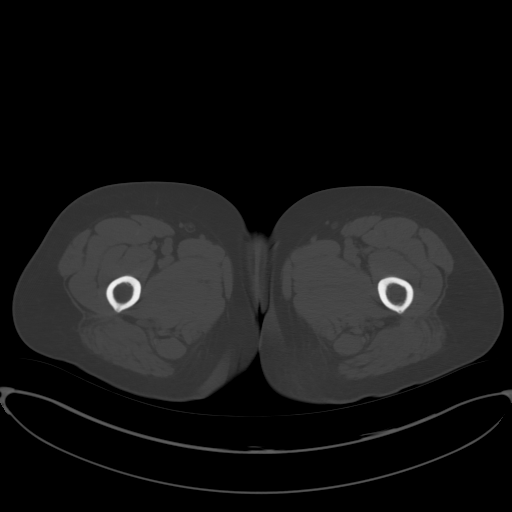
[im 13/100  soft-tissue]
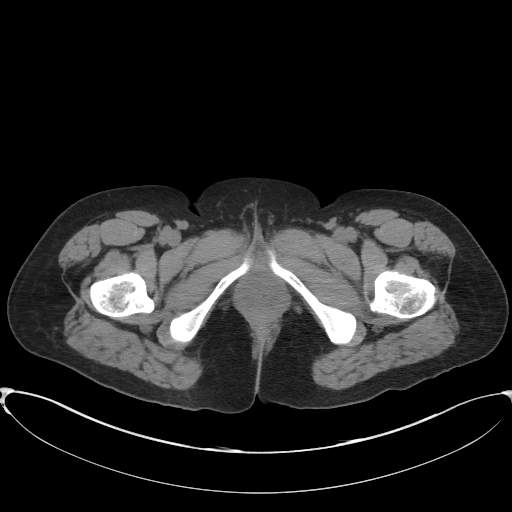
[im 21/100  soft-tissue]
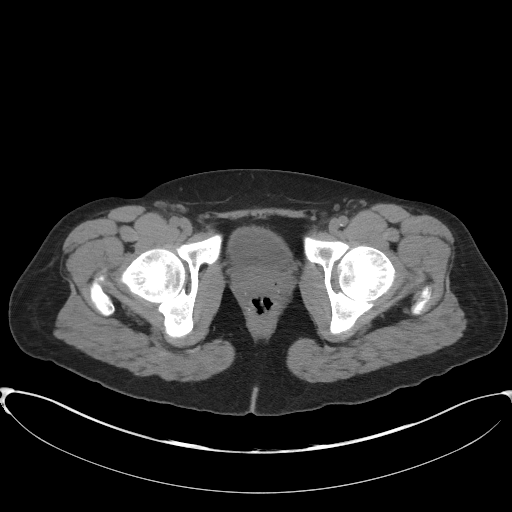
[im 25/100  soft-tissue]
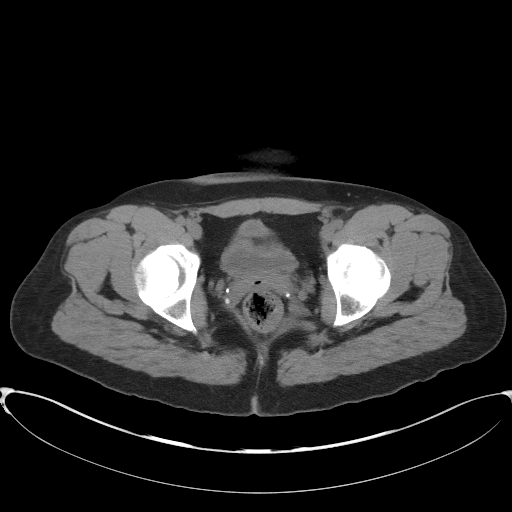
[im 34/100  soft-tissue]
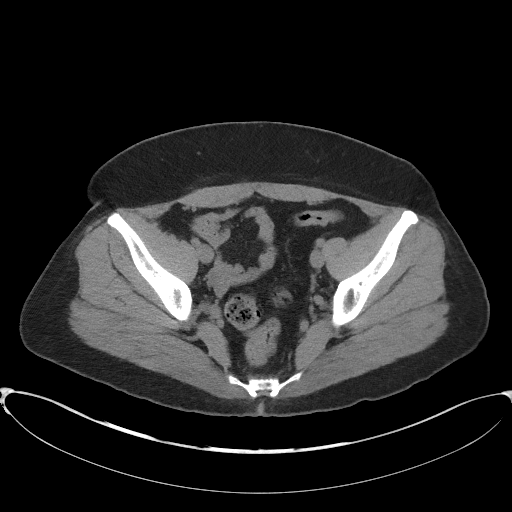
[im 42/100  soft-tissue]
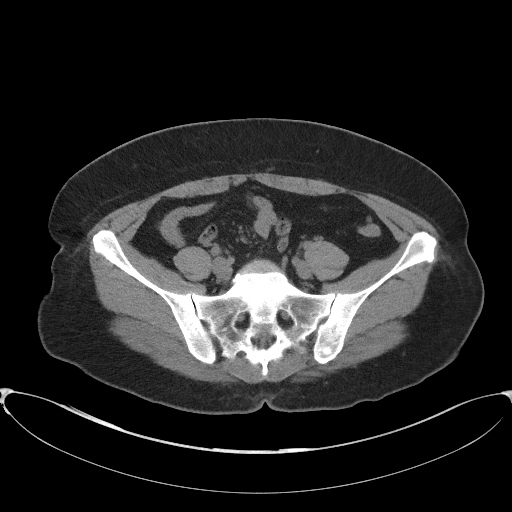
[im 46/100  soft-tissue]
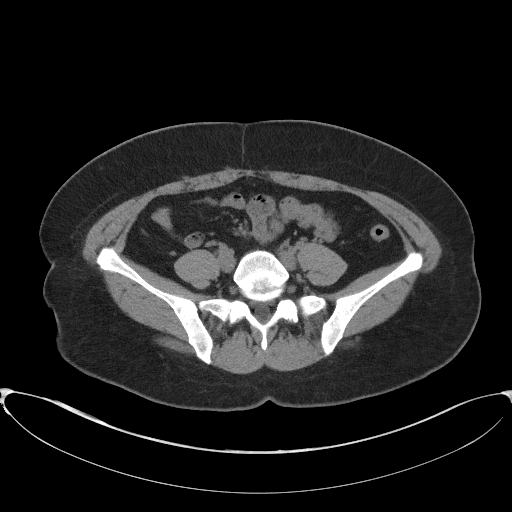
[im 54/100  soft-tissue]
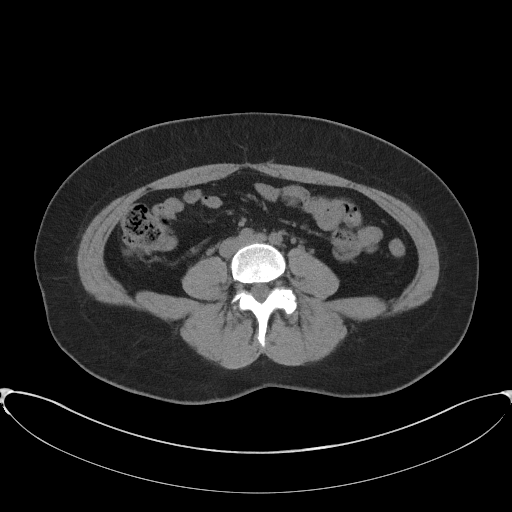
[im 58/100  soft-tissue]
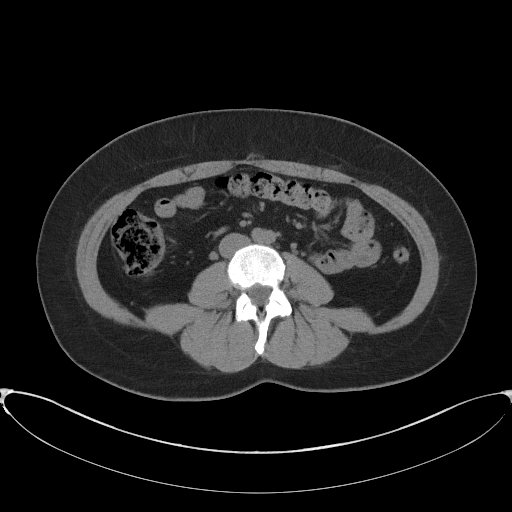
[im 58/100  bone]
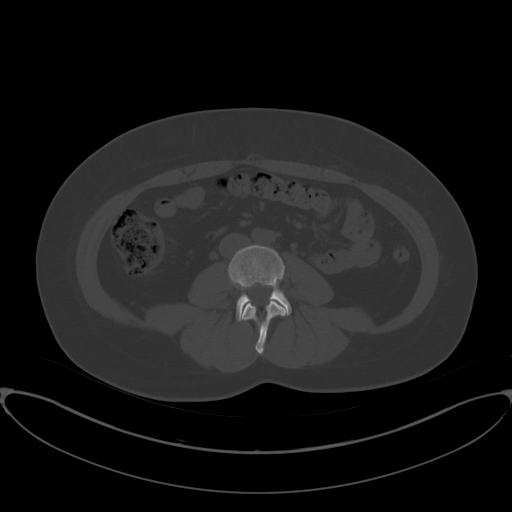
[im 67/100  soft-tissue]
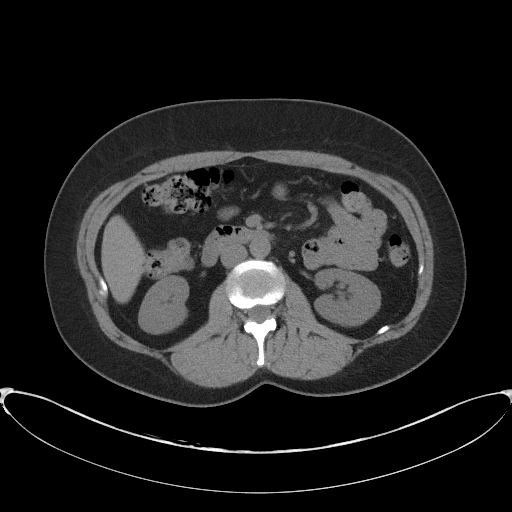
[im 75/100  soft-tissue]
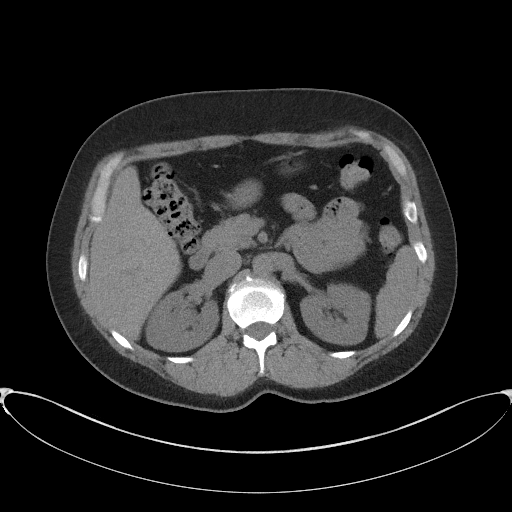
[im 79/100  soft-tissue]
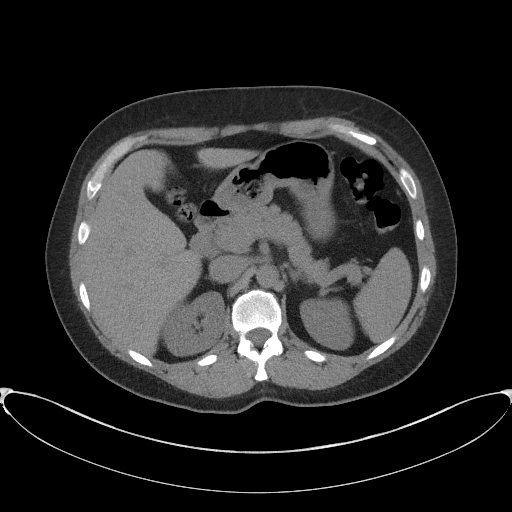
[im 87/100  soft-tissue]
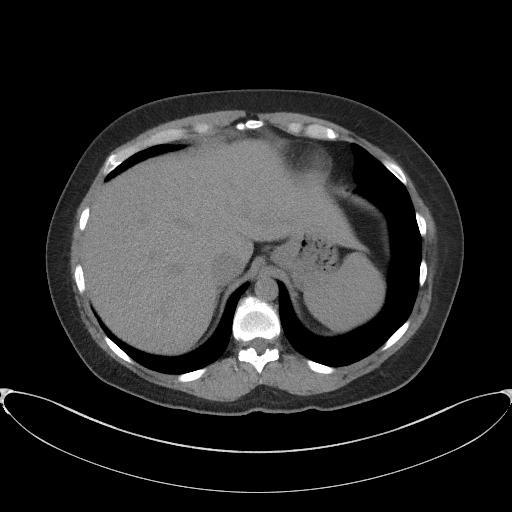
[im 95/100  soft-tissue]
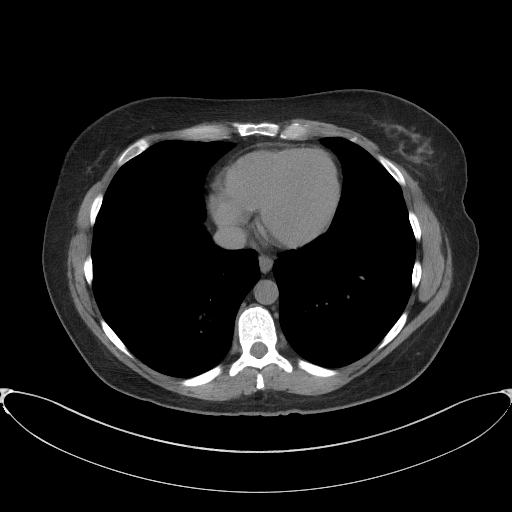

[Series 4: coronal st · coronal · 0.89mm/px · 3 of 101 slices shown]
[im 34/101  soft-tissue]
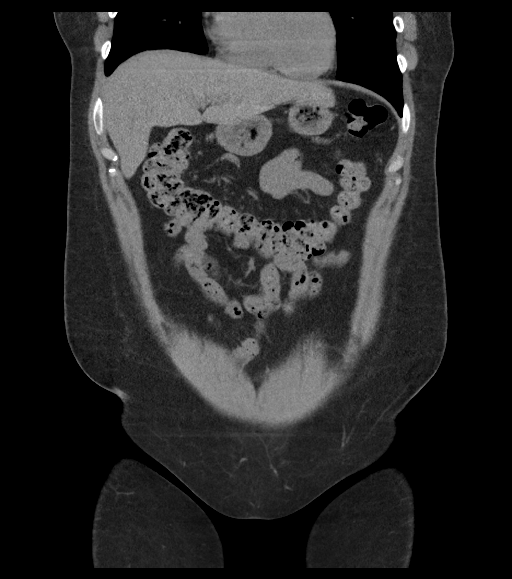
[im 45/101  soft-tissue]
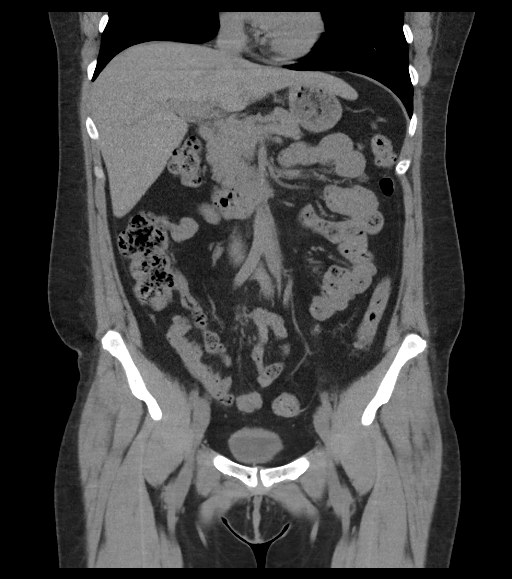
[im 56/101  soft-tissue]
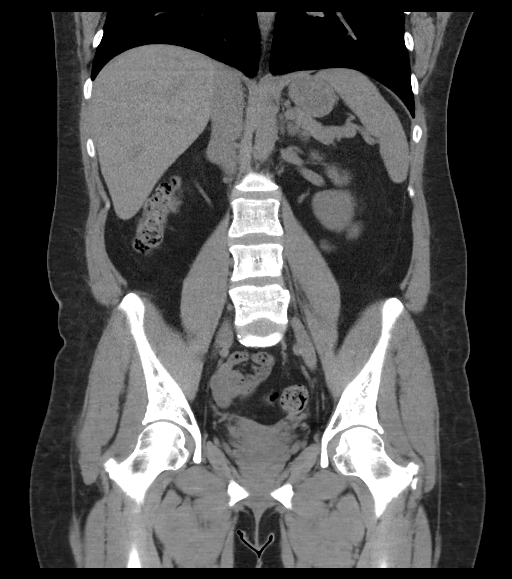

[17 of 46 positions shown; findings below may reference images not displayed]

FINDINGS: Lower chest: Clear lung bases.  Heart normal in size.

Hepatobiliary: No focal liver abnormality is seen. Status post
cholecystectomy. No biliary dilatation.

Pancreas: Unremarkable. No pancreatic ductal dilatation or
surrounding inflammatory changes.

Spleen: Normal in size without focal abnormality.

Adrenals/Urinary Tract: Adrenal glands are unremarkable. Kidneys are
normal, without renal calculi, focal lesion, or hydronephrosis.
Bladder is unremarkable.

Stomach/Bowel: Stomach is within normal limits. Appendix appears
normal. No evidence of bowel wall thickening, distention, or
inflammatory changes.

Vascular/Lymphatic: No significant vascular findings are present. No
enlarged abdominal or pelvic lymph nodes.

Reproductive: Status post hysterectomy. No adnexal masses.

Other: No abdominal wall hernia or abnormality. No abdominopelvic
ascites.

Musculoskeletal: No acute or significant osseous findings.
IMPRESSION: 1. No acute findings. No findings to account for left flank pain
with nausea and vomiting.
2. Status post cholecystectomy and hysterectomy.

## 2018-05-28 ENCOUNTER — Other Ambulatory Visit: Payer: Self-pay

## 2018-05-28 ENCOUNTER — Encounter (HOSPITAL_BASED_OUTPATIENT_CLINIC_OR_DEPARTMENT_OTHER): Payer: Self-pay | Admitting: *Deleted

## 2018-05-28 ENCOUNTER — Emergency Department (HOSPITAL_BASED_OUTPATIENT_CLINIC_OR_DEPARTMENT_OTHER)
Admission: EM | Admit: 2018-05-28 | Discharge: 2018-05-28 | Disposition: A | Discharge status: Home / Self Care | Payer: 59 | Attending: Emergency Medicine | Admitting: Emergency Medicine

## 2018-05-28 ENCOUNTER — Emergency Department (HOSPITAL_BASED_OUTPATIENT_CLINIC_OR_DEPARTMENT_OTHER): Payer: 59

## 2018-05-28 DIAGNOSIS — Y998 Other external cause status: Secondary | ICD-10-CM | POA: Insufficient documentation

## 2018-05-28 DIAGNOSIS — M25461 Effusion, right knee: Secondary | ICD-10-CM | POA: Diagnosis not present

## 2018-05-28 DIAGNOSIS — Z79899 Other long term (current) drug therapy: Secondary | ICD-10-CM | POA: Diagnosis not present

## 2018-05-28 DIAGNOSIS — E785 Hyperlipidemia, unspecified: Secondary | ICD-10-CM | POA: Diagnosis not present

## 2018-05-28 DIAGNOSIS — W010XXA Fall on same level from slipping, tripping and stumbling without subsequent striking against object, initial encounter: Secondary | ICD-10-CM | POA: Insufficient documentation

## 2018-05-28 DIAGNOSIS — Y9389 Activity, other specified: Secondary | ICD-10-CM | POA: Insufficient documentation

## 2018-05-28 DIAGNOSIS — B20 Human immunodeficiency virus [HIV] disease: Secondary | ICD-10-CM | POA: Diagnosis not present

## 2018-05-28 DIAGNOSIS — Y92009 Unspecified place in unspecified non-institutional (private) residence as the place of occurrence of the external cause: Secondary | ICD-10-CM | POA: Diagnosis not present

## 2018-05-28 DIAGNOSIS — S8991XA Unspecified injury of right lower leg, initial encounter: Secondary | ICD-10-CM | POA: Diagnosis present

## 2018-05-28 MED ORDER — OXYCODONE-ACETAMINOPHEN 5-325 MG PO TABS
1.0000 | ORAL_TABLET | ORAL | Status: DC | PRN
  Administered 2018-05-28: 1 via ORAL

## 2018-05-28 MED ORDER — HYDROMORPHONE HCL 1 MG/ML IJ SOLN
0.5000 mg | Freq: Once | INTRAMUSCULAR | Status: AC
  Administered 2018-05-28: 0.5 mg via INTRAMUSCULAR
  Filled 2018-05-28: qty 1

## 2018-05-28 MED ORDER — HYDROCODONE-ACETAMINOPHEN 5-325 MG PO TABS: 1.0000 | ORAL_TABLET | Freq: Four times a day (QID) | ORAL | 0 refills | Status: DC | PRN

## 2018-05-28 MED ORDER — OXYCODONE-ACETAMINOPHEN 5-325 MG PO TABS
ORAL_TABLET | ORAL | Status: AC
  Filled 2018-05-28: qty 1

## 2018-05-28 NOTE — ED Provider Notes (Signed)
[facility] EMERGENCY DEPARTMENT Provider Note   CSN: [id number] Arrival date & time: 05/28/18  1605     History   Chief Complaint Chief Complaint  Patient presents with  . Knee Pain    HPI Tricia Ford is a 47 y.o. female presenting for evaluation of right knee pain after fall.  Patient states she was at her bathroom when she slipped, landing on the lateral aspect of her right knee.  She reports acute onset right knee pain.  She took Tylenol and home Percocet without improvement of symptoms.  Patient is concerned, because she had ORIF of the patella in June, and a meniscectomy December 5.  Incident occurred approximately 5 to 6 hours prior to arrival.  Patient states she is not on blood thinners.  She denies hitting her head or loss of consciousness.  She denies injury elsewhere.  Pain is mostly the lateral aspect of her knee.  She is unable to bear weight due to the pain.  Additional history obtained from chart review.  Patient with 2 recent knee surgeries (06/19 and 12/05) with Dr. Jonnie KindLauffenburger from [facility].   HPI  Past Medical History:  Diagnosis Date  . Anxiety   . Borderline diabetes   . Gilbert syndrome   . Goiter   . HSV infection   . Hyperlipidemia   . Hypertension   . Kidney stone   . Lingular pneumonia   . Liver cirrhosis secondary to NASH (nonalcoholic steatohepatitis) (HCC)   . Liver disease   . Metabolic syndrome   . Migraine   . Multinodular goiter   . MVP (mitral valve prolapse)   . PAC (premature atrial contraction)   . Ruptured ovarian cyst   . Serum ammonia increased (HCC)   . SVT (supraventricular tachycardia) (HCC)   . Thyroid disease   . Torn meniscus   . Vitamin D deficiency     Patient Active Problem List   Diagnosis Date Noted  . Dyspnea 04/03/2014  . Essential hypertension 11/20/2013  . Upper airway cough syndrome 11/17/2013    Past Surgical History:  Procedure Laterality Date  . ABDOMINAL HYSTERECTOMY    .  ABLATION    . CARDIAC CATHETERIZATION    . CARDIAC VALVE SURGERY     Ablation  . CESAREAN SECTION    . GALLBLADDER SURGERY    . KNEE CARTILAGE SURGERY    . liver biopsy    . PACEMAKER INSERTION    . POLYPECTOMY    . TONSILLECTOMY       OB History   No obstetric history on file.      Home Medications    Prior to Admission medications   Medication Sig Start Date End Date Taking? Authorizing Provider  ALPRAZolam Prudy Feeler(XANAX) 0.5 MG tablet Take 0.5 mg by mouth 3 (three) times daily as needed for anxiety.    [provider]  famotidine (PEPCID) 20 MG tablet TAKE 1 TABLET NIGHTLY AT BEDTIME 04/17/14   Nyoka CowdenWert, Michael B, MD  HYDROcodone-acetaminophen (NORCO/VICODIN) 5-325 MG tablet Take 1 tablet by mouth every 6 (six) hours as needed for severe pain. 05/28/18   Inas Avena, PA-C  ibuprofen (ADVIL,MOTRIN) 200 MG tablet Take 800 mg by mouth every 6 (six) hours as needed for mild pain or moderate pain.    [provider]  levothyroxine (SYNTHROID, LEVOTHROID) 100 MCG tablet Take 100 mcg by mouth daily before breakfast.    [provider]  lisinopril (PRINIVIL,ZESTRIL) 20 MG tablet Take 20 mg by mouth daily.  [provider]  losartan (COZAAR) 50 MG tablet Take 1 tablet by mouth daily. 01/24/14   [provider]  naproxen (NAPROSYN) 500 MG tablet Take 1 tablet (500 mg total) by mouth 2 (two) times daily. 07/02/16   Geoffery Lyonselo, Douglas, MD  pantoprazole (PROTONIX) 40 MG tablet TAKE 1 TABLET 30-60 MINUTES BEFORE FIRST MEAL OF THE DAY 07/25/14   Nyoka CowdenWert, Michael B, MD  predniSONE (DELTASONE) 10 MG tablet Take  4 each am x 2 days,   2 each am x 2 days,  1 each am x 2 days and stop 07/18/14   Nyoka CowdenWert, Michael B, MD  traMADol (ULTRAM) 50 MG tablet Take 1 tablet (50 mg total) by mouth every 6 (six) hours as needed. 07/02/16   Geoffery Lyonselo, Douglas, MD  valACYclovir (VALTREX) 500 MG tablet Take 500 mg by mouth 2 (two) times daily as needed (fever blisters).    [provider]  zolpidem (AMBIEN) 10 MG tablet Take 10 mg by mouth at bedtime as needed for sleep.    [provider]    Family History Family History  Problem Relation Age of Onset  . Emphysema Maternal Grandmother   . Clotting disorder Maternal Grandmother   . Rheum arthritis Maternal Grandmother   . Cancer Maternal Grandmother   . Emphysema Mother   . Heart disease Mother   . Cancer Mother   . Emphysema Other        uncles  . Allergies Other        multiple family members  . Asthma Brother   . Asthma Daughter   . Heart disease Brother   . Cancer Other        uncles    Social History Social History   Tobacco Use  . Smoking status: Never Smoker  . Smokeless tobacco: Never Used  Substance Use Topics  . Alcohol use: No    Comment: rarely  . Drug use: No     Allergies   Zomig [zolmitriptan]   Review of Systems Review of Systems  Musculoskeletal: Positive for arthralgias, gait problem and joint swelling.  Neurological: Negative for numbness.  Hematological: Does not bruise/bleed easily.     Physical Exam Updated Vital Signs BP (!) 157/103   Pulse 77   Temp 98.7 F (37.1 C) (Oral)   Resp 18   Ht 5\' 7"  (1.702 m)   Wt 81.6 kg   SpO2 98%   BMI 28.19 kg/m   Physical Exam Vitals signs and nursing note reviewed.  Constitutional:      General: She is not in acute distress.    Appearance: She is well-developed.     Comments: Pt is tearful due to pain, otherwise appears nontoxic  HENT:     Head: Normocephalic and atraumatic.  Eyes:     Conjunctiva/sclera: Conjunctivae normal.  Neck:     Musculoskeletal: Normal range of motion and neck supple.  Cardiovascular:     Rate and Rhythm: Normal rate and regular rhythm.  Pulmonary:     Effort: Pulmonary effort is normal. No respiratory distress.     Breath sounds: Normal breath sounds.  Abdominal:     General: There is no distension.  Musculoskeletal:        General: Swelling and tenderness present.      Comments: Effusion noted of the right knee.  Tenderness palpation of the lateral right knee.  No significant tenderness palpation over the patella or medial knee.  No tenderness palpation of the calf or thigh.  Pedal pulses intact bilaterally.  Sensation intact bilaterally.  Patient unable to perform active or passive range of motion due to pain, however can perform straight leg raise.  Skin:    General: Skin is warm and dry.     Capillary Refill: Capillary refill takes less than 2 seconds.  Neurological:     Mental Status: She is alert and oriented to person, place, and time.     Sensory: No sensory deficit.      ED Treatments / Results  Labs (all labs ordered are listed, but only abnormal results are displayed) Labs Reviewed - No data to display  EKG None  Radiology Dg Knee Complete 4 Views Right  Result Date: 05/28/2018 CLINICAL DATA:  47 year old who fell in the bathroom at home earlier today and injured the RIGHT knee. Prior ORIF of a patellar fracture and recent arthroscopic RIGHT knee surgery approximately 3 weeks ago. Initial encounter. EXAM: RIGHT KNEE - COMPLETE 4+ VIEW COMPARISON:  None. FINDINGS: Prior ORIF of the patellar fracture with compression screws and as yet incomplete healing. No evidence of acute fracture or dislocation. Osseous demineralization for patient age. Mild MEDIAL compartment joint space narrowing. LATERAL and patellofemoral compartment joint spaces well-preserved. Moderate to large joint effusion. IMPRESSION: 1. No acute osseous abnormality. 2. Prior ORIF of the patellar fracture with incomplete healing. 3. 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 4. Osseous demineralization for patient age. 5. Moderate to large joint effusion. Electronically Signed   By: Hulan Saas M.D.   On: 05/28/2018 16:55    Procedures Procedures (including critical care time)  Medications Ordered in ED Medications  HYDROmorphone  (DILAUDID) injection 0.5 mg (0.5 mg Intramuscular Given 05/28/18 1922)     Initial Impression / Assessment and Plan / ED Course  I have reviewed the triage vital signs and the nursing notes.  Pertinent labs & imaging results that were available during my care of the patient were reviewed by me and considered in my medical decision making (see chart for details).     Pt presenting for evaluation of right knee pain after a fall.  Physical exam shows patient who is neurovascularly intact.  She does have significant swelling and recent knee surgeries.  As such, will obtain x-ray.  X-ray viewed interpreted by me, does not show any new fracture dislocation.  Patient with a moderate joint effusion.  Additionally, radiologist reads not healing ORIF of patella.  Considering stay, will consult with patient's orthopedic group for further evaluation.  Discussed with on call ortho, who recommends symptomatic tx, continued crutches use, and f/u in the clinic with Dr. Erlinda Hong next week. Plan discussed with pt, who is agreeable. At this time, pt appears safe for discharge.  Return precautions given.  Patient states she understands agrees plan.  Final Clinical Impressions(s) / ED Diagnoses   Final diagnoses:  Effusion of right knee    ED Discharge Orders         Ordered    HYDROcodone-acetaminophen (NORCO/VICODIN) 5-325 MG tablet  Every 6 hours PRN     05/28/18 1949           Alveria Apley, PA-C 05/28/18 2336    Tilden Fossa, MD 05/29/18 1123

## 2018-05-28 NOTE — ED Notes (Signed)
Pt and family understood dc material. NAD noted. Script given at dc 

## 2018-05-28 NOTE — ED Notes (Signed)
Pt very tearful on assessment.

## 2018-05-28 NOTE — Discharge Instructions (Signed)
Stay non-weight bearing until seen by orthopedics. Use the knee immobilizer as tolerated.  Keep your knee elevated when able.  Use ice for pain and swelling.  Take ibuprofen 3 times a day with meals.  Do not take other anti-inflammatories at the same time (Advil, Motrin, naproxen, Aleve). You may supplement with Tylenol if you need further pain control. Use norco as needed for severe or break through pain.  Follow up with your orthopedic doctor for further evaluation.  Return to the ER with any new, worsening, or concerning symptoms.

## 2018-05-28 NOTE — ED Notes (Signed)
Provider aware of patient's BP

## 2018-05-28 NOTE — ED Triage Notes (Signed)
Pt with extensive knee surgery over the last 6 months. States that she fell this morning onto that knee after slipping. Swelling and inability to bear weight since that time.

## 2020-10-30 ENCOUNTER — Ambulatory Visit (INDEPENDENT_AMBULATORY_CARE_PROVIDER_SITE_OTHER): Payer: Self-pay

## 2020-10-30 ENCOUNTER — Other Ambulatory Visit: Payer: Self-pay

## 2020-10-30 ENCOUNTER — Other Ambulatory Visit: Payer: Self-pay | Admitting: Physician Assistant

## 2020-10-30 DIAGNOSIS — R52 Pain, unspecified: Secondary | ICD-10-CM

## 2020-10-30 DIAGNOSIS — M25511 Pain in right shoulder: Secondary | ICD-10-CM

## 2020-12-03 ENCOUNTER — Emergency Department (HOSPITAL_BASED_OUTPATIENT_CLINIC_OR_DEPARTMENT_OTHER)
Admission: EM | Admit: 2020-12-03 | Discharge: 2020-12-03 | Disposition: A | Discharge status: Home / Self Care | Payer: Self-pay | Attending: Emergency Medicine | Admitting: Emergency Medicine

## 2020-12-03 ENCOUNTER — Emergency Department (HOSPITAL_BASED_OUTPATIENT_CLINIC_OR_DEPARTMENT_OTHER): Payer: Self-pay

## 2020-12-03 ENCOUNTER — Encounter (HOSPITAL_BASED_OUTPATIENT_CLINIC_OR_DEPARTMENT_OTHER): Payer: Self-pay | Admitting: *Deleted

## 2020-12-03 DIAGNOSIS — R0789 Other chest pain: Secondary | ICD-10-CM

## 2020-12-03 DIAGNOSIS — S20212A Contusion of left front wall of thorax, initial encounter: Secondary | ICD-10-CM | POA: Insufficient documentation

## 2020-12-03 DIAGNOSIS — W19XXXA Unspecified fall, initial encounter: Secondary | ICD-10-CM | POA: Insufficient documentation

## 2020-12-03 DIAGNOSIS — R110 Nausea: Secondary | ICD-10-CM | POA: Insufficient documentation

## 2020-12-03 DIAGNOSIS — I10 Essential (primary) hypertension: Secondary | ICD-10-CM | POA: Insufficient documentation

## 2020-12-03 DIAGNOSIS — R0602 Shortness of breath: Secondary | ICD-10-CM | POA: Insufficient documentation

## 2020-12-03 DIAGNOSIS — Z79899 Other long term (current) drug therapy: Secondary | ICD-10-CM | POA: Insufficient documentation

## 2020-12-03 MED ORDER — KETOROLAC TROMETHAMINE 60 MG/2ML IM SOLN
60.0000 mg | Freq: Once | INTRAMUSCULAR | Status: AC
  Administered 2020-12-03: 60 mg via INTRAMUSCULAR
  Filled 2020-12-03: qty 2

## 2020-12-03 MED ORDER — CYCLOBENZAPRINE HCL 10 MG PO TABS: 10.0000 mg | ORAL_TABLET | Freq: Two times a day (BID) | ORAL | 0 refills | Status: DC | PRN

## 2020-12-03 MED ORDER — LIDOCAINE 5 % EX PTCH
1.0000 | MEDICATED_PATCH | CUTANEOUS | 0 refills | Status: DC
Start: 2020-12-03 — End: 2022-09-11

## 2020-12-03 MED ORDER — LIDOCAINE 5 % EX PTCH
1.0000 | MEDICATED_PATCH | CUTANEOUS | Status: DC
  Administered 2020-12-03: 1 via TRANSDERMAL
  Filled 2020-12-03: qty 1

## 2020-12-03 NOTE — ED Triage Notes (Signed)
States she fell Saturday, has pain at left chest

## 2020-12-03 NOTE — ED Provider Notes (Signed)
[facility] EMERGENCY DEPARTMENT Provider Note   CSN: [id number] Arrival date & time: 12/03/20  6834     History Chief Complaint  Patient presents with   Tricia Ford    Tricia Ford is a 50 y.o. female.  HPI     50yo female with history of hypertension, hyperlipidemia, borderline DM, cirrhosis secondary to NASH, SVT, presents with concern for left rib pain after a fall 2 days ago.   2 days ago fell with elbow going into left chest/left ribs. Thinks maybe broke a rib. Sharp pain, worse with movement. Hurts when layign down but much worse with movement. Elbow and arm ok but ribs with severe sharp pain, radiates to the front and to the back. Feels like it is difficult to breath because of the pain. Nausea because the pain is severe. Denies other injuries, no head trauma, headache, neck pain, back pain.    Past Medical History:  Diagnosis Date   Anxiety    Borderline diabetes    Gilbert syndrome    Goiter    HSV infection    Hyperlipidemia    Hypertension    Kidney stone    Lingular pneumonia    Liver cirrhosis secondary to NASH (nonalcoholic steatohepatitis) (HCC)    Liver disease    Metabolic syndrome    Migraine    Multinodular goiter    MVP (mitral valve prolapse)    PAC (premature atrial contraction)    Ruptured ovarian cyst    Serum ammonia increased (HCC)    SVT (supraventricular tachycardia) (HCC)    Thyroid disease    Torn meniscus    Vitamin D deficiency     Patient Active Problem List   Diagnosis Date Noted   Dyspnea 04/03/2014   Essential hypertension 11/20/2013   Upper airway cough syndrome 11/17/2013    Past Surgical History:  Procedure Laterality Date   ABDOMINAL HYSTERECTOMY     ABLATION     CARDIAC CATHETERIZATION     CARDIAC VALVE SURGERY     Ablation   CESAREAN SECTION     GALLBLADDER SURGERY     KNEE CARTILAGE SURGERY     liver biopsy     PACEMAKER INSERTION     POLYPECTOMY     TONSILLECTOMY       OB History   No obstetric  history on file.     Family History  Problem Relation Age of Onset   Emphysema Maternal Grandmother    Clotting disorder Maternal Grandmother    Rheum arthritis Maternal Grandmother    Cancer Maternal Grandmother    Emphysema Mother    Heart disease Mother    Cancer Mother    Emphysema Other        uncles   Allergies Other        multiple family members   Asthma Brother    Asthma Daughter    Heart disease Brother    Cancer Other        uncles    Social History   Tobacco Use   Smoking status: Never   Smokeless tobacco: Never  Substance Use Topics   Alcohol use: No    Comment: rarely   Drug use: No    Home Medications Prior to Admission medications   Medication Sig Start Date End Date Taking? Authorizing Provider  cyclobenzaprine (FLEXERIL) 10 MG tablet Take 1 tablet (10 mg total) by mouth 2 (two) times daily as needed for muscle spasms. 12/03/20  Yes Alvira Monday, MD  ibuprofen (  ADVIL,MOTRIN) 200 MG tablet Take 800 mg by mouth every 6 (six) hours as needed for mild pain or moderate pain.   Yes [provider]  levothyroxine (SYNTHROID, LEVOTHROID) 100 MCG tablet Take 100 mcg by mouth daily before breakfast.   Yes [provider]  lidocaine (LIDODERM) 5 % Place 1 patch onto the skin daily. Remove & Discard patch within 12 hours or as directed by MD 12/03/20  Yes Alvira Monday, MD  lisinopril (PRINIVIL,ZESTRIL) 20 MG tablet Take 20 mg by mouth daily.   Yes [provider]  zolpidem (AMBIEN) 10 MG tablet Take 10 mg by mouth at bedtime as needed for sleep.   Yes [provider]  ALPRAZolam Prudy Feeler) 0.5 MG tablet Take 0.5 mg by mouth 3 (three) times daily as needed for anxiety.    [provider]  famotidine (PEPCID) 20 MG tablet TAKE 1 TABLET NIGHTLY AT BEDTIME 04/17/14   Nyoka Cowden, MD  HYDROcodone-acetaminophen (NORCO/VICODIN) 5-325 MG tablet Take 1 tablet by mouth every 6 (six) hours as needed for severe pain. 05/28/18    Caccavale, Sophia, PA-C  losartan (COZAAR) 50 MG tablet Take 1 tablet by mouth daily. 01/24/14   [provider]  naproxen (NAPROSYN) 500 MG tablet Take 1 tablet (500 mg total) by mouth 2 (two) times daily. 07/02/16   Geoffery Lyons, MD  pantoprazole (PROTONIX) 40 MG tablet TAKE 1 TABLET 30-60 MINUTES BEFORE FIRST MEAL OF THE DAY 07/25/14   Nyoka Cowden, MD  predniSONE (DELTASONE) 10 MG tablet Take  4 each am x 2 days,   2 each am x 2 days,  1 each am x 2 days and stop 07/18/14   Nyoka Cowden, MD  traMADol (ULTRAM) 50 MG tablet Take 1 tablet (50 mg total) by mouth every 6 (six) hours as needed. 07/02/16   Geoffery Lyons, MD  valACYclovir (VALTREX) 500 MG tablet Take 500 mg by mouth 2 (two) times daily as needed (fever blisters).    [provider]    Allergies    Zomig [zolmitriptan]  Review of Systems   Review of Systems  Constitutional:  Negative for fever.  Respiratory:  Positive for shortness of breath. Negative for cough.   Cardiovascular:  Positive for chest pain.  Gastrointestinal:  Positive for nausea (with pain). Negative for abdominal pain and vomiting.  Musculoskeletal:  Negative for neck pain.  Skin:  Negative for rash.  Neurological:  Negative for headaches.   Physical Exam Updated Vital Signs BP (!) 142/97   Pulse 82   Temp 98 F (36.7 C) (Oral)   Resp 15   Ht 5\' 8"  (1.727 m)   Wt 89.8 kg   SpO2 98%   BMI 30.11 kg/m   Physical Exam Vitals and nursing note reviewed.  Constitutional:      General: She is not in acute distress.    Appearance: She is well-developed. She is not diaphoretic.  HENT:     Head: Normocephalic and atraumatic.  Eyes:     Conjunctiva/sclera: Conjunctivae normal.  Neck:     Comments: No cspine tenderness Cardiovascular:     Rate and Rhythm: Normal rate and regular rhythm.     Heart sounds: Normal heart sounds. No murmur heard.   No friction rub. No gallop.  Pulmonary:     Effort: Pulmonary effort is normal. No  respiratory distress.     Breath sounds: Normal breath sounds. No wheezing or rales.  Chest:     Chest wall: Tenderness (  left lateral ribs with extention towards front) present.  Abdominal:     General: There is no distension.     Palpations: Abdomen is soft.     Tenderness: There is no abdominal tenderness. There is no guarding.  Musculoskeletal:        General: No tenderness (no c/t/l tenderness).     Cervical back: Normal range of motion.  Skin:    General: Skin is warm and dry.     Findings: No erythema or rash.  Neurological:     Mental Status: She is alert and oriented to person, place, and time.    ED Results / Procedures / Treatments   Labs (all labs ordered are listed, but only abnormal results are displayed) Labs Reviewed - No data to display  EKG EKG Interpretation  Date/Time:  Tuesday December 03 2020 08:00:33 EDT Ventricular Rate:  67 PR Interval:  179 QRS Duration: 94 QT Interval:  401 QTC Calculation: 424 R Axis:   47 Text Interpretation: Sinus rhythm No significant change since last tracing Confirmed by Alvira Monday ([id number]) on 12/03/2020 8:17:14 AM  Radiology DG Chest 2 View  Result Date: 12/03/2020 CLINICAL DATA:  Fall. EXAM: CHEST - 2 VIEW COMPARISON:  07/18/2014. FINDINGS: Cardiac pacer with lead tip over the right atrium and right ventricle. Heart size normal. Lungs are clear. No pleural effusion or pneumothorax. No acute bony abnormality. Surgical clips right upper quadrant. IMPRESSION: Cardiac pacer with lead tip over the right atrium right ventricle. Heart size normal. No acute pulmonary abnormality identified. Electronically Signed   By: Maisie Fus  Register   On: 12/03/2020 07:58    Procedures Procedures   Medications Ordered in ED Medications  ketorolac (TORADOL) injection 60 mg (60 mg Intramuscular Given 12/03/20 0805)    ED Course  I have reviewed the triage vital signs and the nursing notes.  Pertinent labs & imaging results that were available  during my care of the patient were reviewed by me and considered in my medical decision making (see chart for details).    MDM Rules/Calculators/A&P                           50yo female with history of hypertension, hyperlipidemia, borderline DM, cirrhosis secondary to NASH, SVT, pacemaker in place, presents with concern for left rib pain after a fall 2 days ago.  Denies other injuries or areas of pain.  XR chest shows no evidence of pneumothorax, rib fractures, or pneumonia.  Discussed possibility of occult fx but feel contusion is more likely.  Recommend flexeril, lidocaine patch. She also does have oxycodone she takes for knee pain.  Given spirometer. Patient discharged in stable condition with understanding of reasons to return.    Final Clinical Impression(s) / ED Diagnoses Final diagnoses:  Fall, initial encounter  Rib contusion, left, initial encounter  Chest wall pain    Rx / DC Orders ED Discharge Orders          Ordered    cyclobenzaprine (FLEXERIL) 10 MG tablet  2 times daily PRN        12/03/20 0818    lidocaine (LIDODERM) 5 %  Every 24 hours        12/03/20 0818             Alvira Monday, MD 12/04/20 [phone]

## 2022-09-11 ENCOUNTER — Encounter (HOSPITAL_BASED_OUTPATIENT_CLINIC_OR_DEPARTMENT_OTHER)
Admission: RE | Disposition: A | Discharge status: Home / Self Care | Payer: Self-pay | Source: Ambulatory Visit | Attending: Plastic Surgery

## 2022-09-11 ENCOUNTER — Ambulatory Visit (HOSPITAL_BASED_OUTPATIENT_CLINIC_OR_DEPARTMENT_OTHER)
Admission: RE | Admit: 2022-09-11 | Discharge: 2022-09-11 | Disposition: A | Discharge status: Home / Self Care | Payer: Self-pay | Source: Ambulatory Visit | Attending: Plastic Surgery | Admitting: Plastic Surgery

## 2022-09-11 ENCOUNTER — Other Ambulatory Visit: Payer: Self-pay

## 2022-09-11 ENCOUNTER — Ambulatory Visit (HOSPITAL_BASED_OUTPATIENT_CLINIC_OR_DEPARTMENT_OTHER): Payer: Self-pay | Admitting: Certified Registered"

## 2022-09-11 ENCOUNTER — Encounter (HOSPITAL_BASED_OUTPATIENT_CLINIC_OR_DEPARTMENT_OTHER): Payer: Self-pay | Admitting: Plastic Surgery

## 2022-09-11 DIAGNOSIS — K91872 Postprocedural seroma of a digestive system organ or structure following a digestive system procedure: Secondary | ICD-10-CM

## 2022-09-11 DIAGNOSIS — Z01818 Encounter for other preprocedural examination: Secondary | ICD-10-CM

## 2022-09-11 DIAGNOSIS — I10 Essential (primary) hypertension: Secondary | ICD-10-CM | POA: Insufficient documentation

## 2022-09-11 DIAGNOSIS — K7460 Unspecified cirrhosis of liver: Secondary | ICD-10-CM | POA: Insufficient documentation

## 2022-09-11 DIAGNOSIS — L7634 Postprocedural seroma of skin and subcutaneous tissue following other procedure: Secondary | ICD-10-CM | POA: Insufficient documentation

## 2022-09-11 DIAGNOSIS — Z950 Presence of cardiac pacemaker: Secondary | ICD-10-CM | POA: Insufficient documentation

## 2022-09-11 DIAGNOSIS — I341 Nonrheumatic mitral (valve) prolapse: Secondary | ICD-10-CM | POA: Insufficient documentation

## 2022-09-11 HISTORY — DX: Presence of cardiac pacemaker: Z95.0

## 2022-09-11 HISTORY — PX: HEMATOMA EVACUATION: SHX5118

## 2022-09-11 HISTORY — DX: Other specified postprocedural states: R11.2

## 2022-09-11 HISTORY — DX: Nausea with vomiting, unspecified: Z98.890

## 2022-09-11 SURGERY — EVACUATION HEMATOMA
Anesthesia: General | Site: Abdomen

## 2022-09-11 MED ORDER — VANCOMYCIN HCL IN DEXTROSE 1-5 GM/200ML-% IV SOLN
1000.0000 mg | INTRAVENOUS | Status: AC
  Administered 2022-09-11 (×2): 1000 mg via INTRAVENOUS

## 2022-09-11 MED ORDER — ONDANSETRON HCL 4 MG/2ML IJ SOLN: 4.0000 mg | Freq: Four times a day (QID) | INTRAMUSCULAR | Status: DC | PRN

## 2022-09-11 MED ORDER — PHENYLEPHRINE 80 MCG/ML (10ML) SYRINGE FOR IV PUSH (FOR BLOOD PRESSURE SUPPORT)
PREFILLED_SYRINGE | INTRAVENOUS | Status: DC | PRN
  Administered 2022-09-11: 160 ug via INTRAVENOUS

## 2022-09-11 MED ORDER — ONDANSETRON HCL 4 MG/2ML IJ SOLN
INTRAMUSCULAR | Status: DC | PRN
  Administered 2022-09-11: 4 mg via INTRAVENOUS

## 2022-09-11 MED ORDER — LACTATED RINGERS IV SOLN: INTRAVENOUS | Status: DC

## 2022-09-11 MED ORDER — VANCOMYCIN HCL IN DEXTROSE 1-5 GM/200ML-% IV SOLN
INTRAVENOUS | Status: AC
  Filled 2022-09-11: qty 200

## 2022-09-11 MED ORDER — SODIUM CHLORIDE 0.9 % IV SOLN
INTRAVENOUS | Status: AC
  Filled 2022-09-11: qty 10

## 2022-09-11 MED ORDER — BUPIVACAINE HCL (PF) 0.25 % IJ SOLN
INTRAMUSCULAR | Status: DC | PRN
  Administered 2022-09-11: 20 mL

## 2022-09-11 MED ORDER — FENTANYL CITRATE (PF) 100 MCG/2ML IJ SOLN
INTRAMUSCULAR | Status: AC
  Filled 2022-09-11: qty 2

## 2022-09-11 MED ORDER — DEXAMETHASONE SODIUM PHOSPHATE 10 MG/ML IJ SOLN
INTRAMUSCULAR | Status: AC
  Filled 2022-09-11: qty 1

## 2022-09-11 MED ORDER — OXYCODONE HCL 5 MG PO TABS
5.0000 mg | ORAL_TABLET | Freq: Once | ORAL | Status: AC | PRN
  Administered 2022-09-11: 5 mg via ORAL

## 2022-09-11 MED ORDER — OXYCODONE HCL 5 MG/5ML PO SOLN: 5.0000 mg | Freq: Once | ORAL | Status: AC | PRN

## 2022-09-11 MED ORDER — OXYCODONE HCL 5 MG PO TABS: 5.0000 mg | ORAL_TABLET | Freq: Once | ORAL | Status: DC | PRN

## 2022-09-11 MED ORDER — DIPHENHYDRAMINE HCL 50 MG/ML IJ SOLN
INTRAMUSCULAR | Status: AC
  Filled 2022-09-11: qty 1

## 2022-09-11 MED ORDER — FENTANYL CITRATE (PF) 100 MCG/2ML IJ SOLN
INTRAMUSCULAR | Status: DC | PRN
  Administered 2022-09-11: 25 ug via INTRAVENOUS
  Administered 2022-09-11 (×2): 50 ug via INTRAVENOUS
  Administered 2022-09-11 (×3): 25 ug via INTRAVENOUS

## 2022-09-11 MED ORDER — LIDOCAINE 2% (20 MG/ML) 5 ML SYRINGE
INTRAMUSCULAR | Status: DC | PRN
  Administered 2022-09-11: 100 mg via INTRAVENOUS

## 2022-09-11 MED ORDER — PROPOFOL 10 MG/ML IV BOLUS
INTRAVENOUS | Status: DC | PRN
  Administered 2022-09-11: 200 mg via INTRAVENOUS

## 2022-09-11 MED ORDER — MIDAZOLAM HCL 5 MG/5ML IJ SOLN
INTRAMUSCULAR | Status: DC | PRN
  Administered 2022-09-11: 2 mg via INTRAVENOUS

## 2022-09-11 MED ORDER — MIDAZOLAM HCL 2 MG/2ML IJ SOLN
INTRAMUSCULAR | Status: AC
  Filled 2022-09-11: qty 2

## 2022-09-11 MED ORDER — DIPHENHYDRAMINE HCL 50 MG/ML IJ SOLN
INTRAMUSCULAR | Status: DC | PRN
  Administered 2022-09-11: 12.5 mg via INTRAVENOUS

## 2022-09-11 MED ORDER — FENTANYL CITRATE (PF) 100 MCG/2ML IJ SOLN
25.0000 ug | INTRAMUSCULAR | Status: DC | PRN
  Administered 2022-09-11 (×2): 50 ug via INTRAVENOUS
  Administered 2022-09-11 (×2): 25 ug via INTRAVENOUS

## 2022-09-11 MED ORDER — SODIUM CHLORIDE 0.9 % IV SOLN
INTRAVENOUS | Status: DC | PRN
  Administered 2022-09-11: 500 mL

## 2022-09-11 MED ORDER — ONDANSETRON HCL 4 MG/2ML IJ SOLN
INTRAMUSCULAR | Status: AC
  Filled 2022-09-11: qty 2

## 2022-09-11 MED ORDER — FENTANYL CITRATE (PF) 100 MCG/2ML IJ SOLN: 25.0000 ug | INTRAMUSCULAR | Status: DC | PRN

## 2022-09-11 MED ORDER — OXYCODONE HCL 5 MG/5ML PO SOLN: 5.0000 mg | Freq: Once | ORAL | Status: DC | PRN

## 2022-09-11 MED ORDER — DEXAMETHASONE SODIUM PHOSPHATE 10 MG/ML IJ SOLN
INTRAMUSCULAR | Status: DC | PRN
  Administered 2022-09-11: 10 mg via INTRAVENOUS

## 2022-09-11 MED ORDER — LIDOCAINE 2% (20 MG/ML) 5 ML SYRINGE
INTRAMUSCULAR | Status: AC
  Filled 2022-09-11: qty 5

## 2022-09-11 MED ORDER — OXYCODONE HCL 5 MG PO TABS
ORAL_TABLET | ORAL | Status: AC
  Filled 2022-09-11: qty 1

## 2022-09-11 SURGICAL SUPPLY — 56 items
ADH SKN CLS APL DERMABOND .7 (GAUZE/BANDAGES/DRESSINGS)
APL PRP STRL LF DISP 70% ISPRP (MISCELLANEOUS)
BAG DECANTER FOR FLEXI CONT (MISCELLANEOUS) ×1 IMPLANT
BINDER ABDOMINAL 10 UNV 27-48 (MISCELLANEOUS) IMPLANT
BLADE HEX COATED 2.75 (ELECTRODE) ×1 IMPLANT
BLADE SURG 10 STRL SS (BLADE) IMPLANT
BLADE SURG 15 STRL LF DISP TIS (BLADE) IMPLANT
BLADE SURG 15 STRL SS (BLADE)
BNDG GAUZE DERMACEA FLUFF 4 (GAUZE/BANDAGES/DRESSINGS) IMPLANT
BNDG GZE DERMACEA 4 6PLY (GAUZE/BANDAGES/DRESSINGS)
CANISTER SUCT 1200ML W/VALVE (MISCELLANEOUS) ×1 IMPLANT
CHLORAPREP W/TINT 26 (MISCELLANEOUS) IMPLANT
COVER BACK TABLE 60X90IN (DRAPES) ×1 IMPLANT
COVER MAYO STAND STRL (DRAPES) ×1 IMPLANT
DERMABOND ADVANCED .7 DNX12 (GAUZE/BANDAGES/DRESSINGS) IMPLANT
DRAIN CHANNEL 15F RND FF W/TCR (WOUND CARE) IMPLANT
DRAPE LAPAROSCOPIC ABDOMINAL (DRAPES) ×1 IMPLANT
DRAPE TOP ARMCOVERS (MISCELLANEOUS) IMPLANT
DRAPE U-SHAPE 76X120 STRL (DRAPES) IMPLANT
DRAPE UTILITY XL STRL (DRAPES) ×1 IMPLANT
ELECT COATED BLADE 2.86 ST (ELECTRODE) IMPLANT
ELECT REM PT RETURN 9FT ADLT (ELECTROSURGICAL) ×1
ELECTRODE REM PT RTRN 9FT ADLT (ELECTROSURGICAL) ×1 IMPLANT
EVACUATOR SILICONE 100CC (DRAIN) IMPLANT
GAUZE PAD ABD 8X10 STRL (GAUZE/BANDAGES/DRESSINGS) IMPLANT
GAUZE SPONGE 4X4 12PLY STRL (GAUZE/BANDAGES/DRESSINGS) IMPLANT
GAUZE SPONGE 4X4 12PLY STRL LF (GAUZE/BANDAGES/DRESSINGS) IMPLANT
GLOVE BIO SURGEON STRL SZ 6 (GLOVE) ×1 IMPLANT
GOWN STRL REUS W/ TWL LRG LVL3 (GOWN DISPOSABLE) ×2 IMPLANT
GOWN STRL REUS W/TWL LRG LVL3 (GOWN DISPOSABLE) ×2
NDL HYPO 25X1 1.5 SAFETY (NEEDLE) IMPLANT
NEEDLE HYPO 25X1 1.5 SAFETY (NEEDLE) IMPLANT
NS IRRIG 1000ML POUR BTL (IV SOLUTION) IMPLANT
PACK BASIN DAY SURGERY FS (CUSTOM PROCEDURE TRAY) ×1 IMPLANT
PENCIL SMOKE EVACUATOR (MISCELLANEOUS) ×1 IMPLANT
PIN SAFETY STERILE (MISCELLANEOUS) IMPLANT
SHEET MEDIUM DRAPE 40X70 STRL (DRAPES) IMPLANT
SLEEVE SCD COMPRESS KNEE MED (STOCKING) IMPLANT
SPIKE FLUID TRANSFER (MISCELLANEOUS) IMPLANT
SPONGE T-LAP 18X18 ~~LOC~~+RFID (SPONGE) ×1 IMPLANT
SUT ETHILON 2 0 FS 18 (SUTURE) IMPLANT
SUT MNCRL AB 3-0 PS2 18 (SUTURE) IMPLANT
SUT MNCRL AB 4-0 PS2 18 (SUTURE) IMPLANT
SUT MON AB 5-0 PS2 18 (SUTURE) IMPLANT
SUT PDS AB 0 CT 36 (SUTURE) IMPLANT
SUT PDS AB 2-0 CT2 27 (SUTURE) IMPLANT
SUT VIC AB 3-0 FS2 27 (SUTURE) IMPLANT
SUT VICRYL 4-0 PS2 18IN ABS (SUTURE) ×1 IMPLANT
SWAB COLLECTION DEVICE MRSA (MISCELLANEOUS) IMPLANT
SWAB CULTURE ESWAB REG 1ML (MISCELLANEOUS) IMPLANT
SYR BULB IRRIG 60ML STRL (SYRINGE) ×1 IMPLANT
SYR CONTROL 10ML LL (SYRINGE) IMPLANT
TOWEL GREEN STERILE FF (TOWEL DISPOSABLE) ×2 IMPLANT
TUBE CONNECTING 20X1/4 (TUBING) ×1 IMPLANT
UNDERPAD 30X36 HEAVY ABSORB (UNDERPADS AND DIAPERS) ×2 IMPLANT
YANKAUER SUCT BULB TIP NO VENT (SUCTIONS) ×1 IMPLANT

## 2022-09-11 NOTE — H&P (Signed)
Subjective:    Patient ID: Tricia Ford is a 52 y.o. female.   HPI   5 w 4 days post op [panniculectomy. Seroma noted and drained at 4 w post op. Culture S aureus, completed Bactrim course. Presents today for operative washout for continued cellulitis and drain placement.   Highest weight 236 with pregnancy. Abdominal soft tissue resection 2780 g PMH significant for SVT post ablation, pacemaker placed for sick sinus/sinus brachycardia, HTN, mitral valve prolapse. Nuclear Med stress test 07/2018 normal with EF 64%. History fall in hospital while visiting family and required multiple surgeries on knee and eventual TKR. On oxycodone PRN for pain from this.    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 Lives with spouse and teenage son. Has another adult son.   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 Mother passed from breast ca in 64s.      Review of Systems   Objective:  Physical Exam   Left upper chest pacemaker present  CV: normal heart sounds  Pulm: clear to ausculation   Abd: erythema over lower abdomen along incision line      Assessment:    Panniculitis s/p panniculectomy Pacemaker present   Plan:    Plan operative washout and repeat drain placement. Culture obtained in office today.  Glenna Fellows, MD [facility] Plastic & Reconstructive Surgery  Office/ physician access line after hours [phone]

## 2022-09-11 NOTE — Op Note (Signed)
Operative Note   DATE OF OPERATION: 4.12.24  LOCATION:  [facility]  SURGICAL DIVISION: Plastic Surgery  PREOPERATIVE DIAGNOSES:  1. Seroma abdomen 2. S/p panniculectomy  POSTOPERATIVE DIAGNOSES:  same  PROCEDURE:  Incision drainage seroma  SURGEON: Glenna Fellows MD MBA  ASSISTANT: none  ANESTHESIA:  General.   EBL: 20 ml  COMPLICATIONS: None immediate.   INDICATIONS FOR PROCEDURE:  The patient, Tricia Ford, is a 52 y.o. female born on [birth year], is here for drainage seroma and drain placement following panniculectomy.   FINDINGS: Additional seroma fluid drained. Culture obtained prior to OR.  DESCRIPTION OF PROCEDURE:  The patient's operative site was marked with the patient in the preoperative area. The patient was taken to the operating room. SCDs were placed and IV antibiotics were given. The patient's operative site was prepped and draped in a sterile fashion. A time out was performed and all information was confirmed to be correct. Incision made in central low transverse scar and carried through superficial fascia. Seroma cavity entered and noted to track beneath incision line to left and right of midline. Curettage performed to cavity. Cavity irrigated with saline solution containing Ancef, gentamicin. 19 Fr JP placed in left and right abdomen and secured to skin with 2-0 nylon. Closure midline wound completed with 0 PDS in superficial fascia, 2-0 PDS in dermis and skin closure with 3-0 monocryl subcuticular. Right lateral abdomen incision closed with Interrupted 2-0 PDS in dermis and simple running 3-0 monocryl skin closure. Dermabond applied to central incision. Dry dressing and abdominal binder applied.  The patient was allowed to wake from anesthesia, extubated and taken to the recovery room in satisfactory condition.   SPECIMENS: none  DRAINS: 19 Fr JP in right and left subcutaneous abdomen  Glenna Fellows, MD [facility] Plastic & Reconstructive  Surgery  Office/ physician access line after hours [phone]

## 2022-09-11 NOTE — Anesthesia Preprocedure Evaluation (Signed)
Anesthesia Evaluation  Patient identified by MRN, date of birth, ID band Patient awake    Reviewed: Allergy & Precautions, H&P , NPO status , Patient's Chart, lab work & pertinent test results  History of Anesthesia Complications (+) PONV and history of anesthetic complications  Airway Mallampati: II   Neck ROM: full    Dental   Pulmonary shortness of breath   breath sounds clear to auscultation       Cardiovascular hypertension, + pacemaker + Valvular Problems/Murmurs MVP  Rhythm:regular Rate:Normal     Neuro/Psych  Headaches  Anxiety        GI/Hepatic ,,,(+) Cirrhosis         Endo/Other    Renal/GU Renal disease     Musculoskeletal   Abdominal   Peds  Hematology   Anesthesia Other Findings   Reproductive/Obstetrics                             Anesthesia Physical Anesthesia Plan  ASA: 3  Anesthesia Plan: General   Post-op Pain Management:    Induction: Intravenous  PONV Risk Score and Plan: 4 or greater and Ondansetron, Dexamethasone, Midazolam and Treatment may vary due to age or medical condition  Airway Management Planned: LMA  Additional Equipment:   Intra-op Plan:   Post-operative Plan: Extubation in OR  Informed Consent: I have reviewed the patients History and Physical, chart, labs and discussed the procedure including the risks, benefits and alternatives for the proposed anesthesia with the patient or authorized representative who has indicated his/her understanding and acceptance.     Dental advisory given  Plan Discussed with: CRNA, Anesthesiologist and Surgeon  Anesthesia Plan Comments:        Anesthesia Quick Evaluation

## 2022-09-11 NOTE — Anesthesia Procedure Notes (Signed)
Procedure Name: LMA Insertion Date/Time: 09/11/2022 12:10 PM  Performed by: Pearson Grippe, CRNAPre-anesthesia Checklist: Patient identified, Emergency Drugs available, Suction available and Patient being monitored Patient Re-evaluated:Patient Re-evaluated prior to induction Oxygen Delivery Method: Circle system utilized Preoxygenation: Pre-oxygenation with 100% oxygen Induction Type: IV induction Ventilation: Mask ventilation without difficulty LMA: LMA inserted LMA Size: 4.0 Number of attempts: 1 Airway Equipment and Method: Bite block Placement Confirmation: positive ETCO2 Tube secured with: Tape Dental Injury: Teeth and Oropharynx as per pre-operative assessment

## 2022-09-11 NOTE — Transfer of Care (Signed)
Immediate Anesthesia Transfer of Care Note  Patient: Tricia Ford  Procedure(s) Performed: EVACUATION SEROMA OF ABDOMEN (Abdomen)  Patient Location: PACU  Anesthesia Type:General  Level of Consciousness: awake, alert , and oriented  Airway & Oxygen Therapy: Patient Spontanous Breathing and Patient connected to face mask oxygen  Post-op Assessment: Report given to RN and Post -op Vital signs reviewed and stable  Post vital signs: Reviewed and stable  Last Vitals:  Vitals Value Taken Time  BP 133/102 09/11/22 1310  Temp    Pulse 74 09/11/22 1311  Resp 12 09/11/22 1311  SpO2 100 % 09/11/22 1311  Vitals shown include unvalidated device data.  Last Pain:  Vitals:   09/11/22 1149  TempSrc: Oral  PainSc:       Patients Stated Pain Goal: 3 (09/11/22 1146)  Complications: No notable events documented.

## 2022-09-11 NOTE — Discharge Instructions (Addendum)

## 2022-09-14 ENCOUNTER — Encounter (HOSPITAL_BASED_OUTPATIENT_CLINIC_OR_DEPARTMENT_OTHER): Payer: Self-pay | Admitting: Plastic Surgery

## 2022-09-15 NOTE — Anesthesia Postprocedure Evaluation (Signed)
Anesthesia Post Note  Patient: Tricia Ford  Procedure(s) Performed: EVACUATION SEROMA OF ABDOMEN (Abdomen)     Patient location during evaluation: PACU Anesthesia Type: General Level of consciousness: awake and alert Pain management: pain level controlled Vital Signs Assessment: post-procedure vital signs reviewed and stable Respiratory status: spontaneous breathing, nonlabored ventilation, respiratory function stable and patient connected to nasal cannula oxygen Cardiovascular status: blood pressure returned to baseline and stable Postop Assessment: no apparent nausea or vomiting Anesthetic complications: no   No notable events documented.  Last Vitals:  Vitals:   09/11/22 1429 09/11/22 1445  BP:  (!) 156/96  Pulse:  71  Resp:  16  Temp: (!) 36.3 C (!) 36.3 C  SpO2:  96%    Last Pain:  Vitals:   09/11/22 1500  TempSrc:   PainSc: 3                  [name] S

## 2023-08-10 ENCOUNTER — Other Ambulatory Visit: Payer: Self-pay

## 2023-08-12 LAB — DERMATOLOGY PATHOLOGY
